# Patient Record
Sex: Female | Born: 1985 | Race: White | Hispanic: No | Smoking: Never smoker
Health system: Southern US, Community
[De-identification: ages and names within clinical notes are randomized; demographics above are authoritative.]

## PROBLEM LIST (undated history)

## (undated) ENCOUNTER — Inpatient Hospital Stay (HOSPITAL_COMMUNITY): Payer: Self-pay

## (undated) DIAGNOSIS — J309 Allergic rhinitis, unspecified: Secondary | ICD-10-CM

## (undated) DIAGNOSIS — G43909 Migraine, unspecified, not intractable, without status migrainosus: Secondary | ICD-10-CM

## (undated) HISTORY — DX: Migraine, unspecified, not intractable, without status migrainosus: G43.909

## (undated) HISTORY — PX: TONSILLECTOMY: SUR1361

## (undated) HISTORY — DX: Allergic rhinitis, unspecified: J30.9

---

## 2006-03-15 ENCOUNTER — Other Ambulatory Visit: Admission: RE | Admit: 2006-03-15 | Discharge: 2006-03-15 | Payer: Self-pay | Admitting: Gynecology

## 2013-11-09 ENCOUNTER — Inpatient Hospital Stay (HOSPITAL_COMMUNITY): Payer: Medicaid Other

## 2013-11-09 ENCOUNTER — Encounter (HOSPITAL_COMMUNITY): Payer: Self-pay

## 2013-11-09 ENCOUNTER — Inpatient Hospital Stay (HOSPITAL_COMMUNITY)
Admission: AD | Admit: 2013-11-09 | Discharge: 2013-11-09 | Disposition: A | Discharge status: Home / Self Care | Payer: Medicaid Other | Source: Ambulatory Visit | Attending: Obstetrics & Gynecology | Admitting: Obstetrics & Gynecology

## 2013-11-09 DIAGNOSIS — M545 Low back pain, unspecified: Secondary | ICD-10-CM | POA: Diagnosis not present

## 2013-11-09 DIAGNOSIS — O209 Hemorrhage in early pregnancy, unspecified: Secondary | ICD-10-CM | POA: Diagnosis not present

## 2013-11-09 DIAGNOSIS — R109 Unspecified abdominal pain: Secondary | ICD-10-CM | POA: Insufficient documentation

## 2013-11-09 LAB — WET PREP, GENITAL
CLUE CELLS WET PREP: NONE SEEN
TRICH WET PREP: NONE SEEN
YEAST WET PREP: NONE SEEN

## 2013-11-09 LAB — CBC
HCT: 37.7 % (ref 36.0–46.0)
HEMOGLOBIN: 13.3 g/dL (ref 12.0–15.0)
MCH: 28.9 pg (ref 26.0–34.0)
MCHC: 35.3 g/dL (ref 30.0–36.0)
MCV: 82 fL (ref 78.0–100.0)
Platelets: 278 10*3/uL (ref 150–400)
RBC: 4.6 MIL/uL (ref 3.87–5.11)
RDW: 12.9 % (ref 11.5–15.5)
WBC: 9.5 10*3/uL (ref 4.0–10.5)

## 2013-11-09 LAB — URINALYSIS, ROUTINE W REFLEX MICROSCOPIC
Bilirubin Urine: NEGATIVE
Glucose, UA: NEGATIVE mg/dL
Ketones, ur: NEGATIVE mg/dL
Nitrite: NEGATIVE
PH: 6 (ref 5.0–8.0)
Protein, ur: NEGATIVE mg/dL
Specific Gravity, Urine: >1.03 — ABNORMAL HIGH (ref 1.005–1.030)
Urobilinogen, UA: 0.2 mg/dL (ref 0.0–1.0)

## 2013-11-09 LAB — HCG, QUANTITATIVE, PREGNANCY: HCG, BETA CHAIN, QUANT, S: 9512 m[IU]/mL — AB (ref <5)

## 2013-11-09 LAB — URINE MICROSCOPIC-ADD ON

## 2013-11-09 LAB — ABO/RH: ABO/RH(D): O POS

## 2013-11-09 NOTE — Discharge Instructions (Signed)
Vaginal Bleeding During Pregnancy, First Trimester °A small amount of bleeding (spotting) from the vagina is relatively common in early pregnancy. It usually stops on its own. Various things may cause bleeding or spotting in early pregnancy. Some bleeding may be related to the pregnancy, and some may not. In most cases, the bleeding is normal and is not a problem. However, bleeding can also be a sign of something serious. Be sure to tell your health care provider about any vaginal bleeding right away. °Some possible causes of vaginal bleeding during the first trimester include: °· Infection or inflammation of the cervix. °· Growths (polyps) on the cervix. °· Miscarriage or threatened miscarriage. °· Pregnancy tissue has developed outside of the uterus and in a fallopian tube (tubal pregnancy). °· Tiny cysts have developed in the uterus instead of pregnancy tissue (molar pregnancy). °HOME CARE INSTRUCTIONS  °Watch your condition for any changes. The following actions may help to lessen any discomfort you are feeling: °· Follow your health care provider's instructions for limiting your activity. If your health care provider orders bed rest, you may need to stay in bed and only get up to use the bathroom. However, your health care provider may allow you to continue light activity. °· If needed, make plans for someone to help with your regular activities and responsibilities while you are on bed rest. °· Keep track of the number of pads you use each day, how often you change pads, and how soaked (saturated) they are. Write this down. °· Do not use tampons. Do not douche. °· Do not have sexual intercourse or orgasms until approved by your health care provider. °· If you pass any tissue from your vagina, save the tissue so you can show it to your health care provider. °· Only take over-the-counter or prescription medicines as directed by your health care provider. °· Do not take aspirin because it can make you  bleed. °· Keep all follow-up appointments as directed by your health care provider. °SEEK MEDICAL CARE IF: °· You have any vaginal bleeding during any part of your pregnancy. °· You have cramps or labor pains. °SEEK IMMEDIATE MEDICAL CARE IF:  °· You have severe cramps in your back or belly (abdomen). °· You have a fever, not controlled by medicine. °· You pass large clots or tissue from your vagina. °· Your bleeding increases. °· You feel lightheaded or weak, or you have fainting episodes. °· You have chills. °· You are leaking fluid or have a gush of fluid from your vagina. °· You pass out while having a bowel movement. °MAKE SURE YOU: °· Understand these instructions. °· Will watch your condition. °· Will get help right away if you are not doing well or get worse. °Document Released: 06/14/2005 Document Revised: 06/25/2013 Document Reviewed: 05/12/2013 °ExitCare® Patient Information ©2014 [employer]. ° °

## 2013-11-09 NOTE — MAU Provider Note (Signed)
History     CSN: [id number]  Arrival date and time: 11/09/13 2148   None     Chief Complaint  Patient presents with  . Abdominal Cramping  . Vaginal Bleeding   HPI  Sydney Crosby is a 28 y.o. G3P1001 at [redacted]w[redacted]d who presents today with bleeding and cramping. She was "shocked" when she had a +HPT as this was not a planned pregnancy. Tonight she started having spotting and 7/10 lower abdominal cramping. She is unsure where she will go for [facility] as her OB from her last pregnancy is no longer practicing.    History reviewed. No pertinent past medical history.  History reviewed. No pertinent past surgical history.  History reviewed. No pertinent family history.  History  Substance Use Topics  . Smoking status: Never Smoker   . Smokeless tobacco: Not on file  . Alcohol Use: No    Allergies: No Known Allergies  Prescriptions prior to admission  Medication Sig Dispense Refill  . acetaminophen (TYLENOL) 325 MG tablet Take 325 mg by mouth every 6 (six) hours as needed for mild pain.      . Prenatal Vit-Fe Fumarate-FA (PRENATAL MULTIVITAMIN) TABS tablet Take 1 tablet by mouth daily at 12 noon.        ROS Physical Exam   Blood pressure 137/80, pulse 98, temperature 98.3 F (36.8 C), temperature source Oral, resp. rate 16, height 5\' 1"  (1.549 m), weight 58.605 kg (129 lb 3.2 oz), last menstrual period 09/26/2013.  Physical Exam  Nursing note and vitals reviewed. Constitutional: She is oriented to person, place, and time. She appears well-developed and well-nourished. No distress.  Cardiovascular: Normal rate.   Respiratory: Effort normal.  GI: Soft. There is no tenderness.  Genitourinary:   External: no lesion Vagina: small amount of white discharge. No blood seen now.  Cervix: pink, smooth, no CMT Uterus: NSSC Adnexa: NT;   Neurological: She is alert and oriented to person, place, and time.  Skin: Skin is warm and dry.  Psychiatric: She has a normal mood and affect.     MAU Course  Procedures  Results for orders placed during the hospital encounter of 11/09/13 (from the past 24 hour(s))  URINALYSIS, ROUTINE W REFLEX MICROSCOPIC     Status: Abnormal   Collection Time    11/09/13 10:05 PM      Result Value Ref Range   Color, Urine YELLOW  YELLOW   APPearance CLEAR  CLEAR   Specific Gravity, Urine >1.030 (*) 1.005 - 1.030   pH 6.0  5.0 - 8.0   Glucose, UA NEGATIVE  NEGATIVE mg/dL   Hgb urine dipstick MODERATE (*) NEGATIVE   Bilirubin Urine NEGATIVE  NEGATIVE   Ketones, ur NEGATIVE  NEGATIVE mg/dL   Protein, ur NEGATIVE  NEGATIVE mg/dL   Urobilinogen, UA 0.2  0.0 - 1.0 mg/dL   Nitrite NEGATIVE  NEGATIVE   Leukocytes, UA TRACE (*) NEGATIVE  URINE MICROSCOPIC-ADD ON     Status: Abnormal   Collection Time    11/09/13 10:05 PM      Result Value Ref Range   Squamous Epithelial / LPF FEW (*) RARE   WBC, UA 0-2  <3 WBC/hpf   RBC / HPF 7-10  <3 RBC/hpf   Bacteria, UA FEW (*) RARE  CBC     Status: None   Collection Time    11/09/13 10:08 PM      Result Value Ref Range   WBC 9.5  4.0 - 10.5 K/uL   RBC  4.60  3.87 - 5.11 MIL/uL   Hemoglobin 13.3  12.0 - 15.0 g/dL   HCT 16.137.7  09.636.0 - 04.546.0 %   MCV 82.0  78.0 - 100.0 fL   MCH 28.9  26.0 - 34.0 pg   MCHC 35.3  30.0 - 36.0 g/dL   RDW 40.912.9  81.111.5 - 91.415.5 %   Platelets 278  150 - 400 K/uL  ABO/RH     Status: None   Collection Time    11/09/13 10:08 PM      Result Value Ref Range   ABO/RH(D) O POS    HCG, QUANTITATIVE, PREGNANCY     Status: Abnormal   Collection Time    11/09/13 10:08 PM      Result Value Ref Range   hCG, Beta Chain, Quant, S 9512 (*) <5 mIU/mL  WET PREP, GENITAL     Status: Abnormal   Collection Time    11/09/13 10:35 PM      Result Value Ref Range   Yeast Wet Prep HPF POC NONE SEEN  NONE SEEN   Trich, Wet Prep NONE SEEN  NONE SEEN   Clue Cells Wet Prep HPF POC NONE SEEN  NONE SEEN   WBC, Wet Prep HPF POC MODERATE (*) NONE SEEN   [place] Ob Comp Less 14 Wks  11/09/2013   CLINICAL  DATA:  28 year old pregnant female with pelvic pain and bleeding. Estimated gestational age of [redacted] weeks 2 days by LMP. Beta HCG of 9,500.  EXAM: TRANSVAGINAL OB ULTRASOUND; OBSTETRIC <14 WK ULTRASOUND  TECHNIQUE: Transvaginal ultrasound was performed for complete evaluation of the gestation as well as the maternal uterus, adnexal regions, and pelvic cul-de-sac.  COMPARISON:  None.  FINDINGS: Intrauterine gestational sac: Visualized/normal in shape.  Yolk sac:  Visualized.  Embryo:  Not visualized  MSD: 8.4  mm   5 w   3  d    US EDC: 07/09/2014  Maternal uterus/adnexae: Small hypoechoic structures adjacent to gestational sac her noted. A small subchorionic hemorrhage is present.  The ovaries bilaterally are unremarkable.  There is no evidence of adnexal mass.  A small amount of free fluid is present.  IMPRESSION: Single intrauterine gestational sac containing a yolk sac. Embryo is not identified at this time. Estimated gestational age by mean sac diameter is 5 weeks 3 days.  Small subchronic hemorrhage.  Small hypoechoic areas along the endometrial canal of uncertain clinical significance.   Electronically Signed   By: Laveda AbbeJeff  Hu M.D.   On: 11/09/2013 23:14   [place] Ob Transvaginal  11/09/2013   CLINICAL DATA:  28 year old pregnant female with pelvic pain and bleeding. Estimated gestational age of [redacted] weeks 2 days by LMP. Beta HCG of 9,500.  EXAM: TRANSVAGINAL OB ULTRASOUND; OBSTETRIC <14 WK ULTRASOUND  TECHNIQUE: Transvaginal ultrasound was performed for complete evaluation of the gestation as well as the maternal uterus, adnexal regions, and pelvic cul-de-sac.  COMPARISON:  None.  FINDINGS: Intrauterine gestational sac: Visualized/normal in shape.  Yolk sac:  Visualized.  Embryo:  Not visualized  MSD: 8.4  mm   5 w   3  d    US EDC: 07/09/2014  Maternal uterus/adnexae: Small hypoechoic structures adjacent to gestational sac her noted. A small subchorionic hemorrhage is present.  The ovaries bilaterally are unremarkable.   There is no evidence of adnexal mass.  A small amount of free fluid is present.  IMPRESSION: Single intrauterine gestational sac containing a yolk sac. Embryo is not identified at this time. Estimated  gestational age by mean sac diameter is 5 weeks 3 days.  Small subchronic hemorrhage.  Small hypoechoic areas along the endometrial canal of uncertain clinical significance.   Electronically Signed   By: Laveda Abbe M.D.   On: 11/09/2013 23:14     Assessment and Plan   1. Vaginal bleeding in pregnant patient at less than [redacted] weeks gestation    Bleeding precautions First trimester danger signs Start [facility] as soon as possible Return to MAU as needed  Follow-up Information   Schedule an appointment as soon as possible for a visit with [facility] [place].   Contact information:   [address] [place] [address] [phone]       Tawnya Crook 11/09/2013, 11:21 PM

## 2013-11-09 NOTE — MAU Note (Signed)
Pt LMP 09/26/2013, +UPT at Dr. Frutoso [place]'s office.  Having cramping, bleeding and lower back pain today. No bleeding at this time.

## 2013-11-10 LAB — GC/CHLAMYDIA PROBE AMP
CT Probe RNA: NEGATIVE
GC Probe RNA: NEGATIVE

## 2013-11-14 ENCOUNTER — Encounter (HOSPITAL_COMMUNITY): Payer: Self-pay | Admitting: *Deleted

## 2013-11-14 ENCOUNTER — Inpatient Hospital Stay (HOSPITAL_COMMUNITY): Payer: Medicaid Other

## 2013-11-14 ENCOUNTER — Inpatient Hospital Stay (HOSPITAL_COMMUNITY)
Admission: AD | Admit: 2013-11-14 | Discharge: 2013-11-14 | Disposition: A | Discharge status: Home / Self Care | Payer: Medicaid Other | Source: Ambulatory Visit | Attending: Obstetrics & Gynecology | Admitting: Obstetrics & Gynecology

## 2013-11-14 DIAGNOSIS — O26859 Spotting complicating pregnancy, unspecified trimester: Secondary | ICD-10-CM

## 2013-11-14 DIAGNOSIS — O209 Hemorrhage in early pregnancy, unspecified: Secondary | ICD-10-CM | POA: Diagnosis not present

## 2013-11-14 DIAGNOSIS — R109 Unspecified abdominal pain: Secondary | ICD-10-CM | POA: Insufficient documentation

## 2013-11-14 DIAGNOSIS — O26899 Other specified pregnancy related conditions, unspecified trimester: Secondary | ICD-10-CM

## 2013-11-14 LAB — CBC WITH DIFFERENTIAL/PLATELET
BASOS ABS: 0 10*3/uL (ref 0.0–0.1)
BASOS PCT: 0 % (ref 0–1)
Eosinophils Absolute: 0.1 10*3/uL (ref 0.0–0.7)
Eosinophils Relative: 1 % (ref 0–5)
HCT: 37.4 % (ref 36.0–46.0)
HEMOGLOBIN: 13 g/dL (ref 12.0–15.0)
Lymphocytes Relative: 23 % (ref 12–46)
Lymphs Abs: 2.3 10*3/uL (ref 0.7–4.0)
MCH: 28.3 pg (ref 26.0–34.0)
MCHC: 34.8 g/dL (ref 30.0–36.0)
MCV: 81.3 fL (ref 78.0–100.0)
Monocytes Absolute: 1.1 10*3/uL — ABNORMAL HIGH (ref 0.1–1.0)
Monocytes Relative: 11 % (ref 3–12)
NEUTROS ABS: 6.8 10*3/uL (ref 1.7–7.7)
Neutrophils Relative %: 66 % (ref 43–77)
Platelets: 296 10*3/uL (ref 150–400)
RBC: 4.6 MIL/uL (ref 3.87–5.11)
RDW: 12.7 % (ref 11.5–15.5)
WBC: 10.3 10*3/uL (ref 4.0–10.5)

## 2013-11-14 LAB — BASIC METABOLIC PANEL
BUN: 5 mg/dL — ABNORMAL LOW (ref 6–23)
CHLORIDE: 100 meq/L (ref 96–112)
CO2: 23 mEq/L (ref 19–32)
Calcium: 9.4 mg/dL (ref 8.4–10.5)
Creatinine, Ser: 0.59 mg/dL (ref 0.50–1.10)
GFR calc non Af Amer: >90 mL/min (ref >90)
Glucose, Bld: 89 mg/dL (ref 70–99)
POTASSIUM: 4 meq/L (ref 3.7–5.3)
Sodium: 138 mEq/L (ref 137–147)

## 2013-11-14 LAB — HCG, QUANTITATIVE, PREGNANCY: HCG, BETA CHAIN, QUANT, S: 18500 m[IU]/mL — AB (ref <5)

## 2013-11-14 MED ORDER — ONDANSETRON HCL 4 MG/2ML IJ SOLN
4.0000 mg | Freq: Once | INTRAMUSCULAR | Status: AC
  Administered 2013-11-14: 4 mg via INTRAVENOUS
  Filled 2013-11-14: qty 2

## 2013-11-14 MED ORDER — IBUPROFEN 600 MG PO TABS
600.0000 mg | ORAL_TABLET | Freq: Once | ORAL | Status: AC
  Administered 2013-11-14: 600 mg via ORAL
  Filled 2013-11-14: qty 1

## 2013-11-14 MED ORDER — LACTATED RINGERS IV SOLN
INTRAVENOUS | Status: DC
  Administered 2013-11-14: 17:00:00 via INTRAVENOUS

## 2013-11-14 MED ORDER — HYDROMORPHONE HCL PF 1 MG/ML IJ SOLN
1.0000 mg | Freq: Once | INTRAMUSCULAR | Status: AC
  Administered 2013-11-14: 1 mg via INTRAVENOUS
  Filled 2013-11-14: qty 1

## 2013-11-14 NOTE — Discharge Instructions (Signed)
Your ultrasound today shows that you are [redacted] weeks pregnant and there is a heart beat. You continue to have some bleeding around the pregnancy. This will hopefully resolve by [redacted] weeks gestation. If you have heavy bleeding, severe pain or other problems, return here.

## 2013-11-14 NOTE — MAU Note (Signed)
Patient states she has been having abdominal pain and had heavy bright red bleeding today. Feels nauseated, no vomiting.

## 2013-11-14 NOTE — MAU Provider Note (Signed)
CSN: [id number]     Arrival date & time 11/14/13  1602 History   None    Chief Complaint  Patient presents with  . Abdominal Pain  . Vaginal Bleeding     (Consider location/radiation/quality/duration/timing/severity/associated sxs/prior Treatment) Patient is a 28 y.o. female presenting with abdominal pain and vaginal bleeding.  Abdominal Pain The primary symptoms of the illness include abdominal pain and vaginal bleeding. The current episode started 3 to 5 hours ago. The onset of the illness was sudden.  The patient states that she believes she is currently pregnant. Additional symptoms associated with the illness include back pain.  Vaginal Bleeding Associated symptoms include abdominal pain.   Sydney Crosby is a 28 y.o. G2P1001 @ [redacted]w[redacted]d gestation who presents to the MAU with abdominal cramping. She has had some mild cramping and bleeding off and on but the severe pain started about 3 hours prior to arrival to MAU.  Past Medical History  Diagnosis Date  . Medical history non-contributory    Past Surgical History  Procedure Laterality Date  . No past surgeries     No family history on file. History  Substance Use Topics  . Smoking status: Never Smoker   . Smokeless tobacco: Not on file  . Alcohol Use: No   OB History   Grav Para Term Preterm Abortions TAB SAB Ect Mult Living   2 1 1       1      Review of Systems  Gastrointestinal: Positive for abdominal pain.  Genitourinary: Positive for vaginal bleeding.  Musculoskeletal: Positive for back pain.      Allergies  Review of patient's allergies indicates no known allergies.  Home Medications  No current outpatient prescriptions on file. BP 138/90  Pulse 83  Temp(Src) 99 F (37.2 C) (Oral)  Resp 16  Ht 5\' 1"  (1.549 m)  Wt 130 lb 12.8 oz (59.33 kg)  BMI 24.73 kg/m2  SpO2 100%  LMP 09/26/2013 Physical Exam  Nursing note and vitals reviewed. Constitutional: She is oriented to person, place, and time. She appears  well-developed and well-nourished.  HENT:  Head: Atraumatic.  Eyes: EOM are normal.  Neck: Neck supple.  Cardiovascular: Normal rate.   Pulmonary/Chest: Effort normal.  Abdominal: Soft. There is tenderness in the right lower quadrant and left lower quadrant. There is no rebound, no guarding and no CVA tenderness.  Genitourinary:  External genitalia without lesions. Cervix fingertip, small blood vaginal vault. Uterus slightly enlarged.   Musculoskeletal: Normal range of motion.  Neurological: She is alert and oriented to person, place, and time. No cranial nerve deficit.  Skin: Skin is warm and dry.  Psychiatric: She has a normal mood and affect. Her behavior is normal.    Results for orders placed during the hospital encounter of 11/14/13 (from the past 24 hour(s))  CBC WITH DIFFERENTIAL     Status: Abnormal   Collection Time    11/14/13  4:53 PM      Result Value Ref Range   WBC 10.3  4.0 - 10.5 K/uL   RBC 4.60  3.87 - 5.11 MIL/uL   Hemoglobin 13.0  12.0 - 15.0 g/dL   HCT 82.9  56.2 - 13.0 %   MCV 81.3  78.0 - 100.0 fL   MCH 28.3  26.0 - 34.0 pg   MCHC 34.8  30.0 - 36.0 g/dL   RDW 86.5  78.4 - 69.6 %   Platelets 296  150 - 400 K/uL   Neutrophils Relative % 66  43 - 77 %   Neutro Abs 6.8  1.7 - 7.7 K/uL   Lymphocytes Relative 23  12 - 46 %   Lymphs Abs 2.3  0.7 - 4.0 K/uL   Monocytes Relative 11  3 - 12 %   Monocytes Absolute 1.1 (*) 0.1 - 1.0 K/uL   Eosinophils Relative 1  0 - 5 %   Eosinophils Absolute 0.1  0.0 - 0.7 K/uL   Basophils Relative 0  0 - 1 %   Basophils Absolute 0.0  0.0 - 0.1 K/uL  BASIC METABOLIC PANEL     Status: Abnormal   Collection Time    11/14/13  4:53 PM      Result Value Ref Range   Sodium 138  137 - 147 mEq/L   Potassium 4.0  3.7 - 5.3 mEq/L   Chloride 100  96 - 112 mEq/L   CO2 23  19 - 32 mEq/L   Glucose, Bld 89  70 - 99 mg/dL   BUN 5 (*) 6 - 23 mg/dL   Creatinine, Ser 7.820.59  0.50 - 1.10 mg/dL   Calcium 9.4  8.4 - 95.610.5 mg/dL   GFR calc non  Af Amer >90  >90 mL/min   GFR calc Af Amer >90  >90 mL/min  HCG, QUANTITATIVE, PREGNANCY     Status: Abnormal   Collection Time    11/14/13  4:53 PM      Result Value Ref Range   hCG, Beta Chain, Quant, S 18500 (*) <5 mIU/mL    ED Course  Procedures [place] Ob Transvaginal  11/14/2013   CLINICAL DATA:  Bleeding, pregnant  EXAM: TRANSVAGINAL OB ULTRASOUND  TECHNIQUE: Transvaginal ultrasound was performed for complete evaluation of the gestation as well as the maternal uterus, adnexal regions, and pelvic cul-de-sac.  COMPARISON:  11/09/2013  FINDINGS: Intrauterine gestational sac: Present, single  Yolk sac:  Present  Embryo:  Present  Cardiac Activity: Present  Heart Rate: 114 bpm  MSD:   mm    w     d  CRL:   2.9  mm   6 w 0d                  US EDC: 07/09/2014  Maternal uterus/adnexae: There is a large subchorionic hemorrhage.  Right ovary 41 x 22 x 32 mm  Left ovary 29 x 18 x 14 mm  Small amount of left pelvic free fluid  IMPRESSION: 1. Single live 6 week 0 day intrauterine gestation. 2. Subchorionic hemorrhage. 3. Small amount of nonspecific pelvic fluid.   Electronically Signed   By: Oley Balmaniel  Hassell M.D.   On: 11/14/2013 18:45     MDM  28 y.o. G2P1001 @ 3622w0d gestation by LMP with bleeding and cramping. Stable for discharge with ultrasound showing a 6 week viable IUP. I have reviewed this patient's vital signs, nurses notes, appropriate labs and imaging.  I have discussed findings in detail with the patient and possibility of increased bleeding and possibility of miscarriage. Instructions given on threatened AB. She will start her prenatal care. She will return here for problems.

## 2013-11-18 NOTE — MAU Provider Note (Signed)
Attestation of Attending Supervision of Advanced Practitioner (CNM/NP): Evaluation and management procedures were performed by the Advanced Practitioner under my supervision and collaboration. I have reviewed the Advanced Practitioner's note and chart, and I agree with the management and plan.  Alajia Schmelzer H. 10:46 PM

## 2013-12-27 ENCOUNTER — Inpatient Hospital Stay (HOSPITAL_COMMUNITY)
Admission: AD | Admit: 2013-12-27 | Discharge: 2013-12-27 | Disposition: A | Discharge status: Home / Self Care | Payer: Medicaid Other | Source: Ambulatory Visit | Attending: Obstetrics & Gynecology | Admitting: Obstetrics & Gynecology

## 2013-12-27 ENCOUNTER — Encounter (HOSPITAL_COMMUNITY): Payer: Self-pay | Admitting: *Deleted

## 2013-12-27 ENCOUNTER — Inpatient Hospital Stay (HOSPITAL_COMMUNITY): Payer: Medicaid Other

## 2013-12-27 DIAGNOSIS — O209 Hemorrhage in early pregnancy, unspecified: Secondary | ICD-10-CM | POA: Insufficient documentation

## 2013-12-27 DIAGNOSIS — R109 Unspecified abdominal pain: Secondary | ICD-10-CM | POA: Insufficient documentation

## 2013-12-27 DIAGNOSIS — M545 Low back pain, unspecified: Secondary | ICD-10-CM | POA: Insufficient documentation

## 2013-12-27 DIAGNOSIS — O208 Other hemorrhage in early pregnancy: Secondary | ICD-10-CM | POA: Insufficient documentation

## 2013-12-27 LAB — URINALYSIS, ROUTINE W REFLEX MICROSCOPIC
Bilirubin Urine: NEGATIVE
Glucose, UA: NEGATIVE mg/dL
Ketones, ur: NEGATIVE mg/dL
Nitrite: NEGATIVE
Protein, ur: NEGATIVE mg/dL
SPECIFIC GRAVITY, URINE: 1.01 (ref 1.005–1.030)
UROBILINOGEN UA: 0.2 mg/dL (ref 0.0–1.0)
pH: 7 (ref 5.0–8.0)

## 2013-12-27 LAB — URINE MICROSCOPIC-ADD ON

## 2013-12-27 MED ORDER — ACETAMINOPHEN 325 MG PO TABS
650.0000 mg | ORAL_TABLET | Freq: Once | ORAL | Status: AC
  Administered 2013-12-27: 650 mg via ORAL
  Filled 2013-12-27: qty 2

## 2013-12-27 NOTE — MAU Provider Note (Signed)
History     CSN: [id number]  Arrival date and time: 12/27/13 1013   First Provider Initiated Contact with Patient 12/27/13 1135      Chief Complaint  Patient presents with  . Vaginal Bleeding   HPI Sydney Crosby 28 y.o. [redacted]w[redacted]d   Awakened today with vaginal bleeding at 2 am.  Has had periodic cramping as well.  Passed one lime sized clot and had pain when it passed.  Has an appointment to start prenatal care in [place] on 01-07-14.  Was seen here previously in MAU and had a large subchorionic hemorrhage earlier in the pregnancy.  Has had periodic spotting and small amounts of blood.  Today the bleeding was heavier and client is very worried.  Asking for pain medication to help with the cramping.  OB History   Grav Para Term Preterm Abortions TAB SAB Ect Mult Living   2 1 1       1       Past Medical History  Diagnosis Date  . Medical history non-contributory     Past Surgical History  Procedure Laterality Date  . No past surgeries      History reviewed. No pertinent family history.  History  Substance Use Topics  . Smoking status: Never Smoker   . Smokeless tobacco: Never Used  . Alcohol Use: No    Allergies: No Known Allergies  Prescriptions prior to admission  Medication Sig Dispense Refill  . acetaminophen (TYLENOL) 325 MG tablet Take 325 mg by mouth every 6 (six) hours as needed for mild pain.      . diphenhydrAMINE (BENADRYL) 25 mg capsule Take 25 mg by mouth every 6 (six) hours as needed for allergies.      . Prenatal Vit-Fe Fumarate-FA (PRENATAL MULTIVITAMIN) TABS tablet Take 1 tablet by mouth daily at 12 noon.        Review of Systems  Constitutional: Negative for fever.  Gastrointestinal: Positive for abdominal pain. Negative for nausea and vomiting.  Genitourinary:       No vaginal discharge. Vaginal bleeding. No dysuria.   Physical Exam   Blood pressure 124/73, pulse 94, temperature 98.2 F (36.8 C), temperature source Oral, resp. rate 18, height  5' (1.524 m), weight 129 lb (58.514 kg), last menstrual period 09/26/2013.  Physical Exam  Nursing note and vitals reviewed. Constitutional: She is oriented to person, place, and time. She appears well-developed and well-nourished.  HENT:  Head: Normocephalic.  Eyes: EOM are normal.  Neck: Neck supple.  GI: Soft. There is tenderness. There is no rebound and no guarding.  Mild diffuse tenderness  Genitourinary:  Speculum exam: Vagina - Traces of dark blood seen.  No active bleeding. Cervix - Small clot see in cervical os Bimanual exam: Cervix multiparous, closed, not palpated as long, question funneling Uterus non tender Adnexa non tender, no masses bilaterally Chaperone present for exam.  Musculoskeletal: Normal range of motion.  Neurological: She is alert and oriented to person, place, and time.  Skin: Skin is warm and dry.  Psychiatric: She has a normal mood and affect.    MAU Course  Procedures Results for orders placed during the hospital encounter of 12/27/13 (from the past 24 hour(s))  URINALYSIS, ROUTINE W REFLEX MICROSCOPIC     Status: Abnormal   Collection Time    12/27/13 10:32 AM      Result Value Ref Range   Color, Urine AMBER (*) YELLOW   APPearance CLEAR  CLEAR   Specific Gravity, Urine 1.010  1.005 - 1.030   pH 7.0  5.0 - 8.0   Glucose, UA NEGATIVE  NEGATIVE mg/dL   Hgb urine dipstick LARGE (*) NEGATIVE   Bilirubin Urine NEGATIVE  NEGATIVE   Ketones, ur NEGATIVE  NEGATIVE mg/dL   Protein, ur NEGATIVE  NEGATIVE mg/dL   Urobilinogen, UA 0.2  0.0 - 1.0 mg/dL   Nitrite NEGATIVE  NEGATIVE   Leukocytes, UA SMALL (*) NEGATIVE  URINE MICROSCOPIC-ADD ON     Status: Abnormal   Collection Time    12/27/13 10:32 AM      Result Value Ref Range   Squamous Epithelial / LPF MANY (*) RARE   WBC, UA 3-6  <3 WBC/hpf   RBC / HPF 7-10  <3 RBC/hpf   Bacteria, UA RARE  RARE   Urine-Other MUCOUS PRESENT     MDM CLINICAL DATA: Vaginal bleeding. Assess cervical length.  Estimated  gestational age is 13 weeks 1 day. Subchorionic hemorrhage was  identified on ultrasound dated 11/14/2013.  EXAM:  OBSTETRIC <14 WK ULTRASOUND  TECHNIQUE:  Transabdominal ultrasound was performed for evaluation of the  gestation as well as the maternal uterus and adnexal regions.  COMPARISON: Obstetric ultrasound 11/14/2013  FINDINGS:  Intrauterine gestational sac: Visualized/normal in shape.  Yolk sac: Visualized  Embryo: Visualized  Cardiac Activity: Visualized  Heart Rate: 169 bpm  CRL: 59.4 mm 12 w 4 d US EDC: 07/07/2014  Maternal uterus/adnexae: Small subchorionic hemorrhage is  visualized, measuring 2.9 x 0.9 x 0.8 cm. This collection appears  slightly smaller than it did on 11/14/2013.  IMPRESSION:  1. Single living intrauterine gestation with a sign gestational age  of [redacted] weeks 1 day. Estimated gestational age by today's ultrasound  is concordant.  2. Small subchorionic hemorrhage. The subchorionic collection  appears smaller on today's ultrasound than on 11/14/2013.  Ultrasound for fetal anatomic evaluation at 18-[redacted] weeks gestational  age is recommended.    Assessment and Plan  Vaginal bleeding in pregnancy - lessening subchorionic hemorrhage  Plan Continue with Prenatal care as planned Pelvic rest until you see your doctor Drink at least 8 8-oz glasses of water every day. Take Tylenol 325 mg 2 tablets by mouth every 4 hours if needed for pain. Seek additional care if your bleeding or your pain worsens.  Abigal Choung L Alysse Rathe 12/27/2013, 11:45 AM

## 2013-12-27 NOTE — MAU Provider Note (Signed)
Attestation of Attending Supervision of Advanced Practitioner (PA/CNM/NP): Evaluation and management procedures were performed by the Advanced Practitioner under my supervision and collaboration.  I have reviewed the Advanced Practitioner's note and chart, and I agree with the management and plan.  Sahej Schrieber, MD, FACOG Attending Obstetrician & Gynecologist Faculty Practice, [facility]  

## 2013-12-27 NOTE — Discharge Instructions (Signed)
Continue with Prenatal care as planned Pelvic rest until you see your doctor Drink at least 8 8-oz glasses of water every day. Take Tylenol 325 mg 2 tablets by mouth every 4 hours if needed for pain. Seek additional care if your bleeding or your pain worsens.

## 2013-12-27 NOTE — MAU Note (Signed)
Pt presents with complaints of bright red vaginal bleeding since early this morning and passed a large colt. She has been having some lower back pain today also.

## 2014-07-20 ENCOUNTER — Encounter (HOSPITAL_COMMUNITY): Payer: Self-pay | Admitting: *Deleted

## 2014-09-14 ENCOUNTER — Encounter (HOSPITAL_COMMUNITY): Payer: Self-pay | Admitting: *Deleted

## 2015-08-08 IMAGING — US US OB COMP LESS 14 WK
1 series · 14 of 28 positions shown · non-contrast
Comparison: None.

CLINICAL DATA: 28-year-old pregnant female with pelvic pain and
bleeding. Estimated gestational age of 5 weeks 2 days by LMP. Beta
HCG of [DATE].

EXAM:
TRANSVAGINAL OB ULTRASOUND; OBSTETRIC <14 WK ULTRASOUND
TECHNIQUE: Transvaginal ultrasound was performed for complete evaluation of the
gestation as well as the maternal uterus, adnexal regions, and
pelvic cul-de-sac.

[Series 1: us ob comp less 14 wks · 14 of 56 slices shown]
[im 3/56]
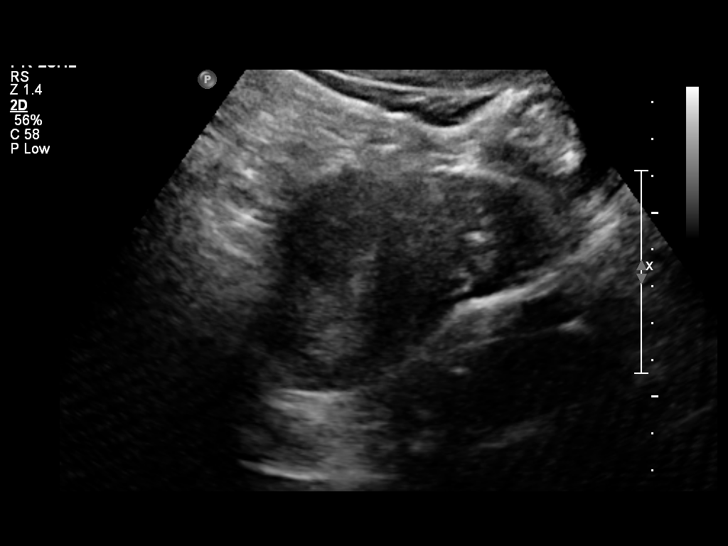
[im 7/56]
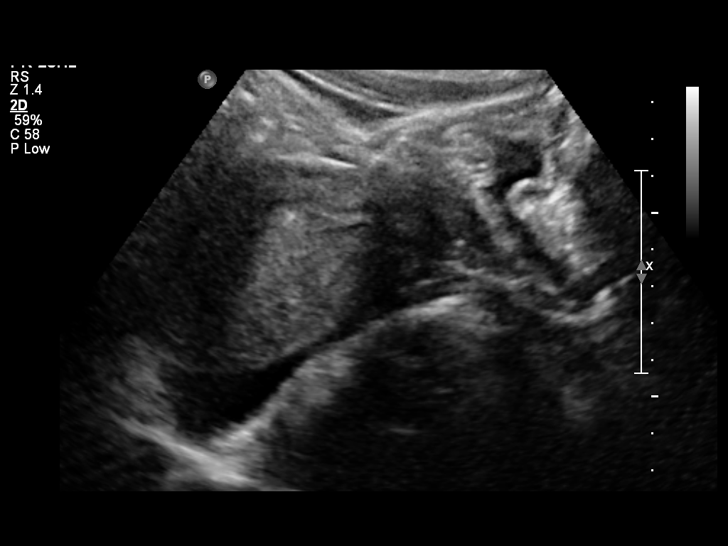
[im 11/56]
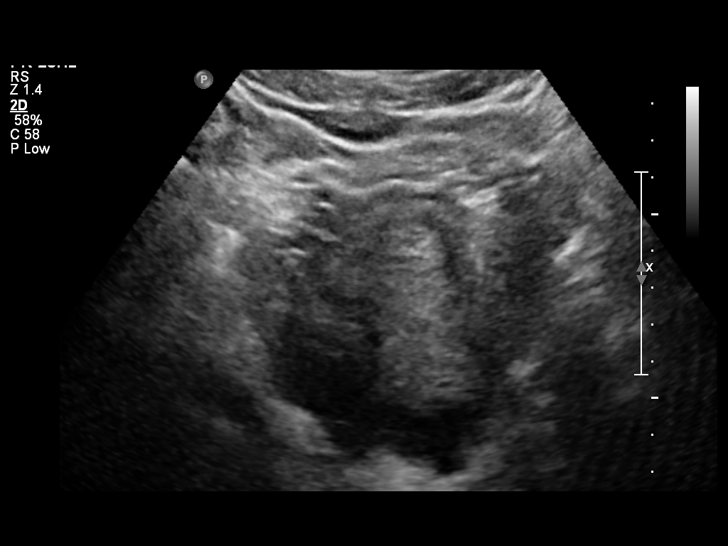
[im 15/56]
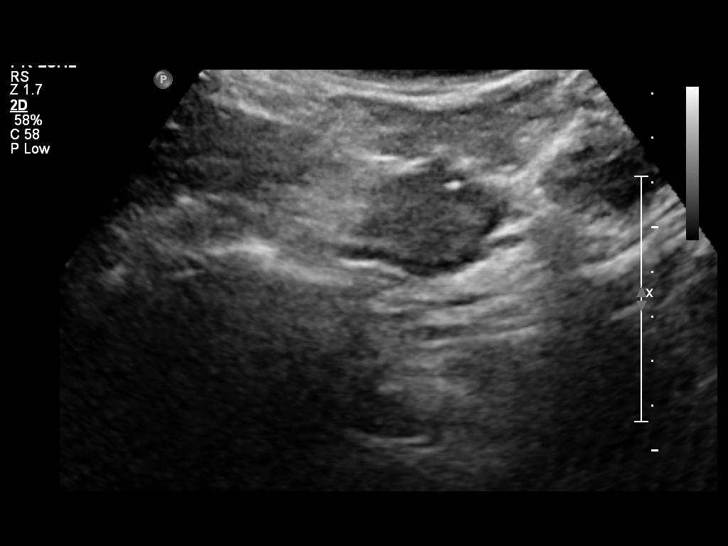
[im 19/56]
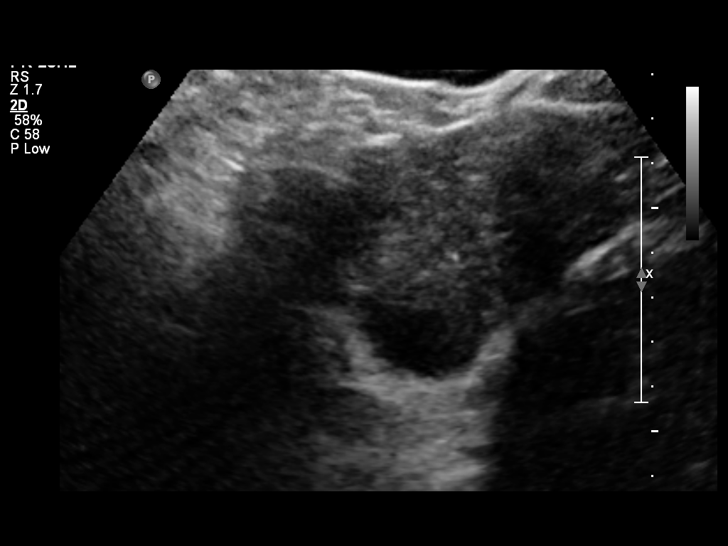
[im 23/56]
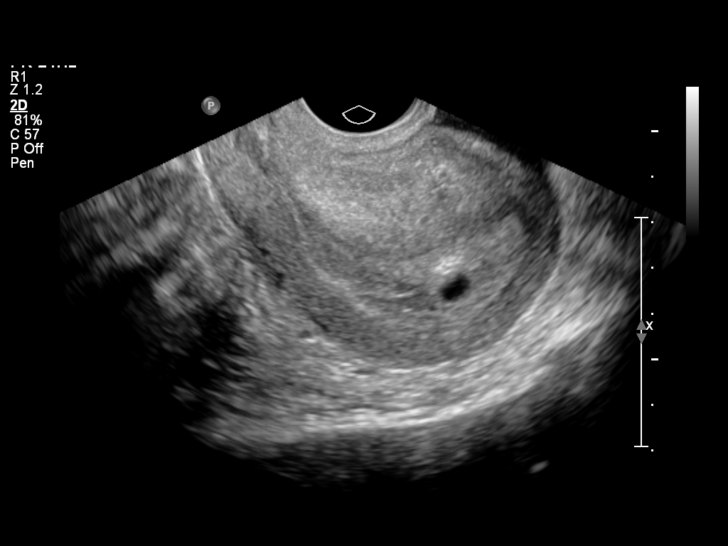
[im 27/56]
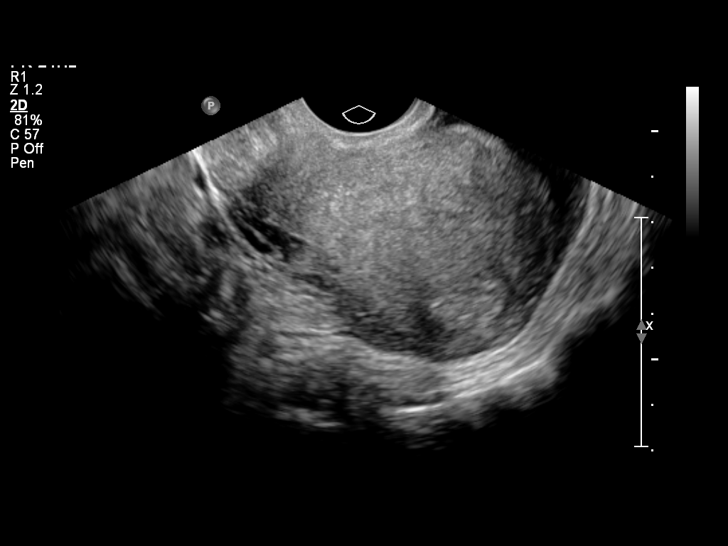
[im 31/56]
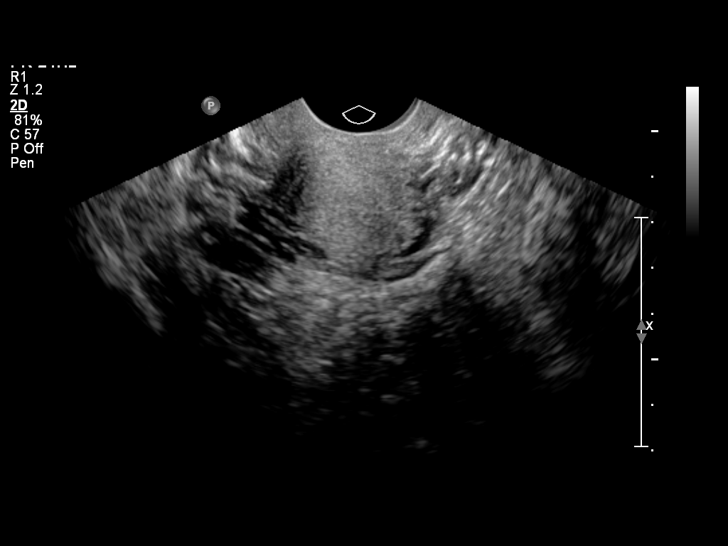
[im 35/56]
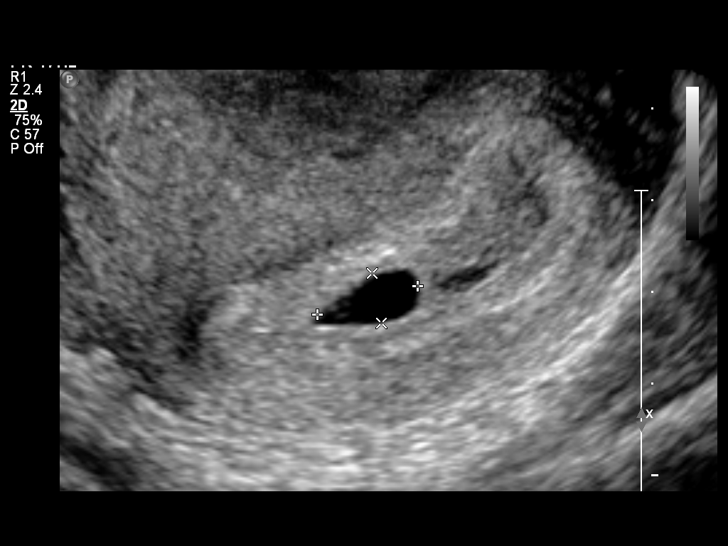
[im 39/56]
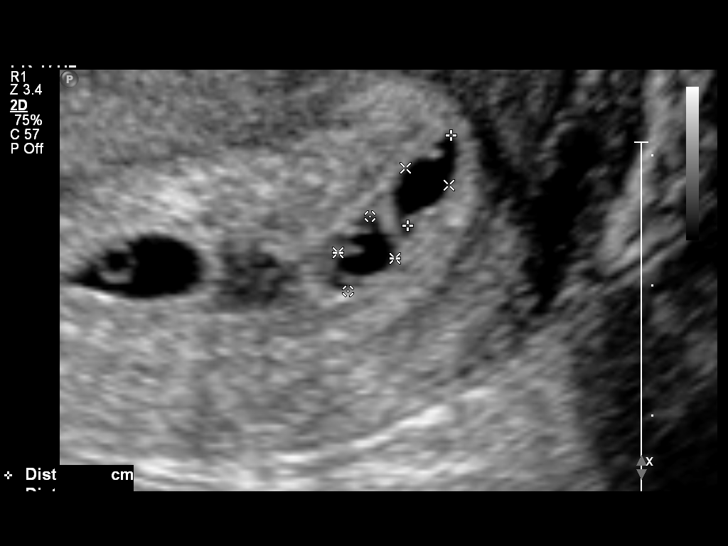
[im 43/56]
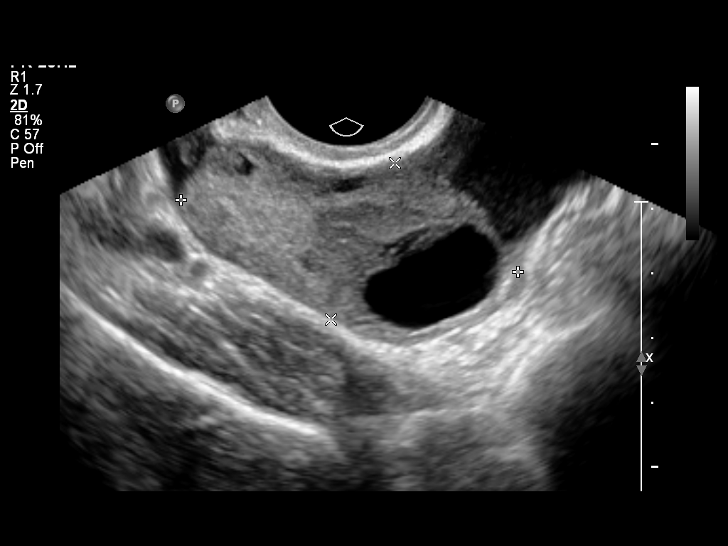
[im 47/56]
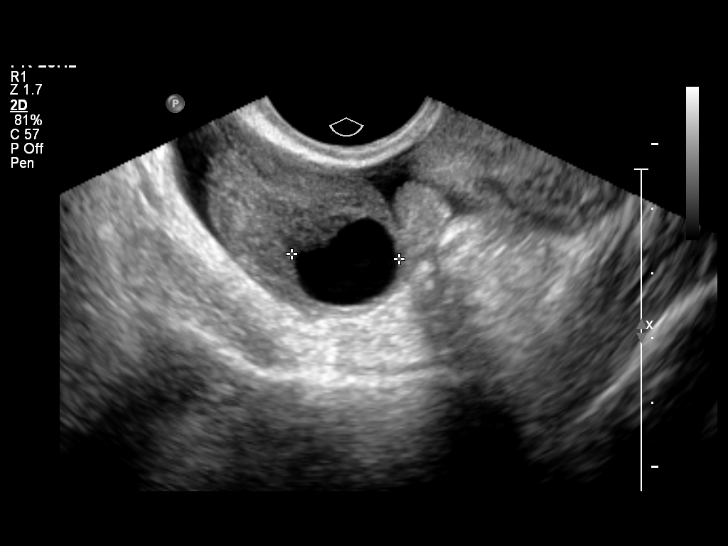
[im 51/56]
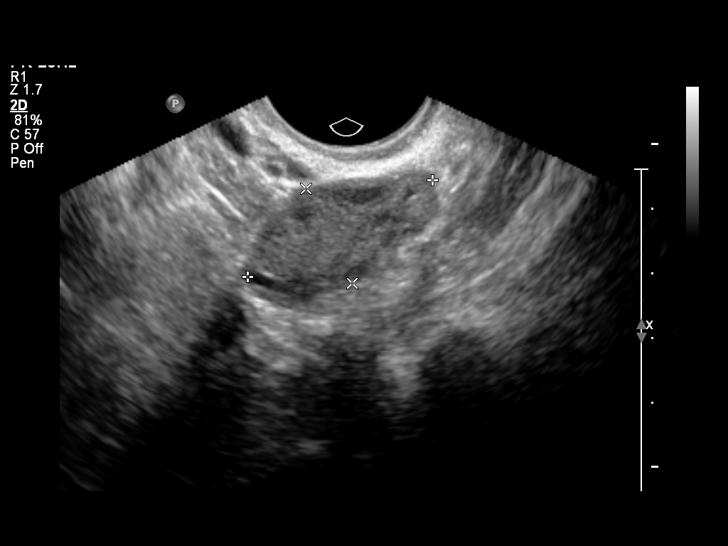
[im 56/56]
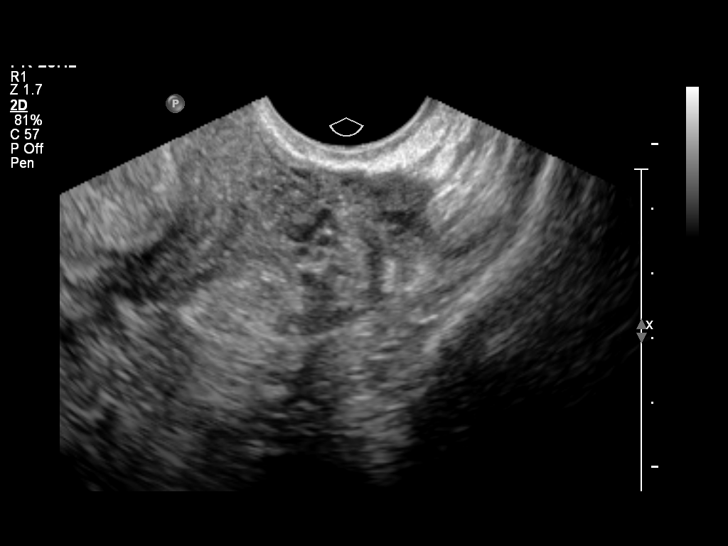

[14 of 28 positions shown; findings below may reference images not displayed]

FINDINGS: Intrauterine gestational sac: Visualized/normal in shape.

Yolk sac:  Visualized.

Embryo:  Not visualized

MSD: 8.4  mm   5 w   3  d    US EDC: 07/09/2014

Maternal uterus/adnexae: Small hypoechoic structures adjacent to
gestational sac her noted. A small subchorionic hemorrhage is
present.

The ovaries bilaterally are unremarkable.

There is no evidence of adnexal mass.

A small amount of free fluid is present.
IMPRESSION: Single intrauterine gestational sac containing a yolk sac. Embryo is
not identified at this time. Estimated gestational age by mean sac
diameter is 5 weeks 3 days.

Small subchronic hemorrhage.

Small hypoechoic areas along the endometrial canal of uncertain
clinical significance.

## 2015-08-13 IMAGING — US US OB TRANSVAGINAL
1 series · 14 of 28 positions shown · non-contrast
Comparison: 11/09/2013

CLINICAL DATA: Bleeding, pregnant

EXAM:
TRANSVAGINAL OB ULTRASOUND
TECHNIQUE: Transvaginal ultrasound was performed for complete evaluation of the
gestation as well as the maternal uterus, adnexal regions, and
pelvic cul-de-sac.

[Series 1: us ob comp less 14 wks · 14 of 28 slices shown]
[im 2/28]
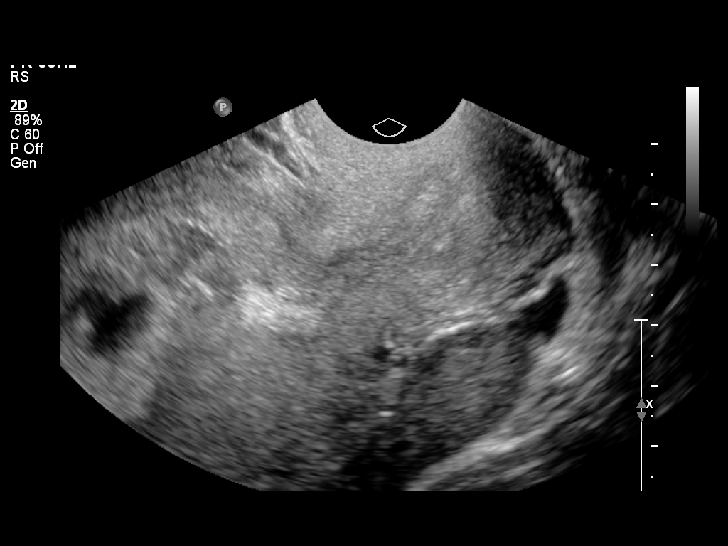
[im 4/28]
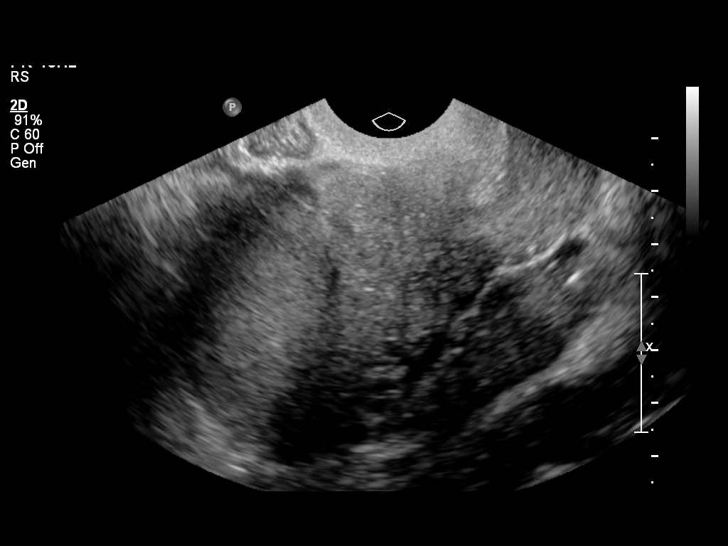
[im 6/28]
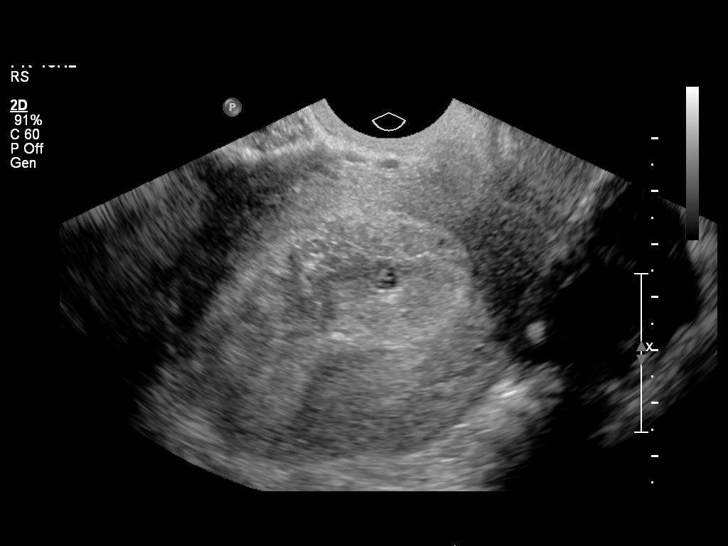
[im 8/28]
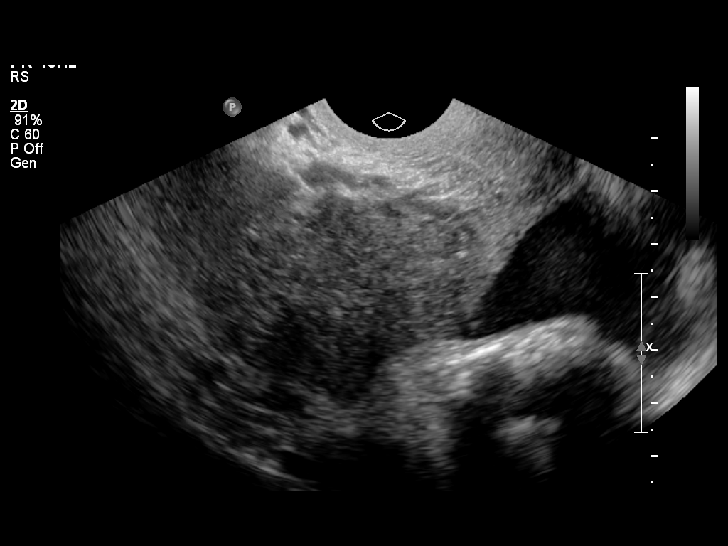
[im 10/28]
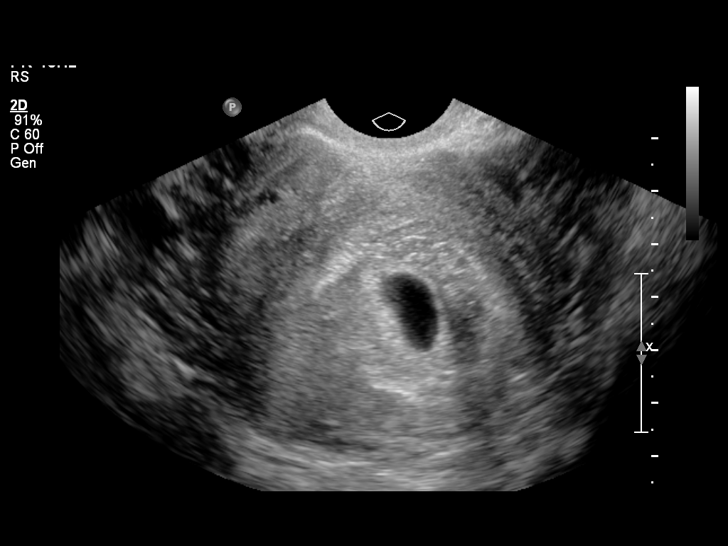
[im 12/28]
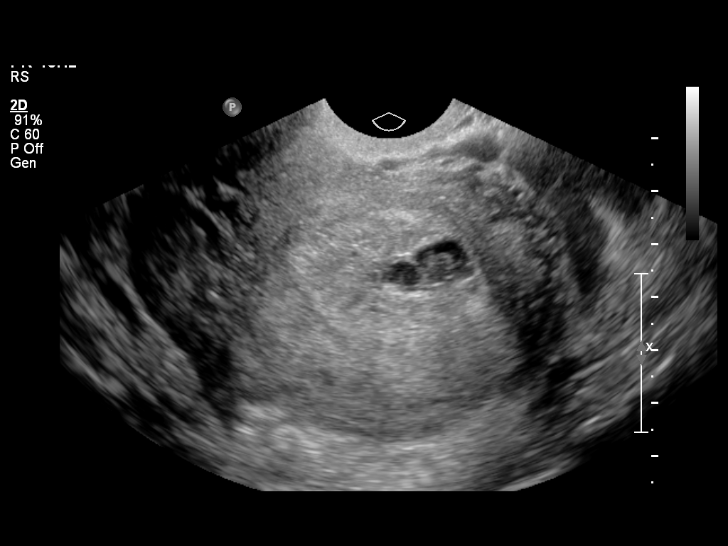
[im 14/28]
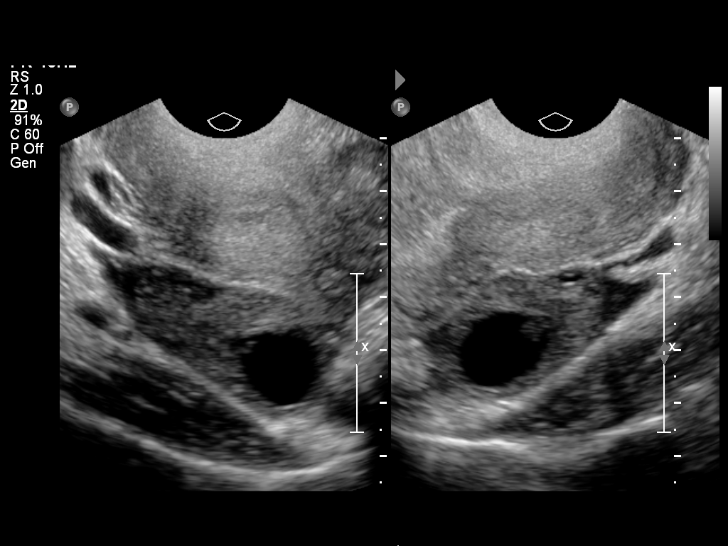
[im 16/28]
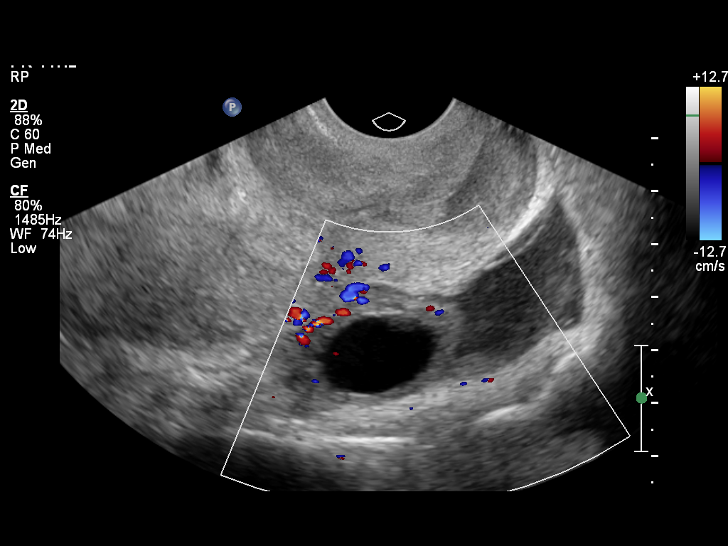
[im 18/28]
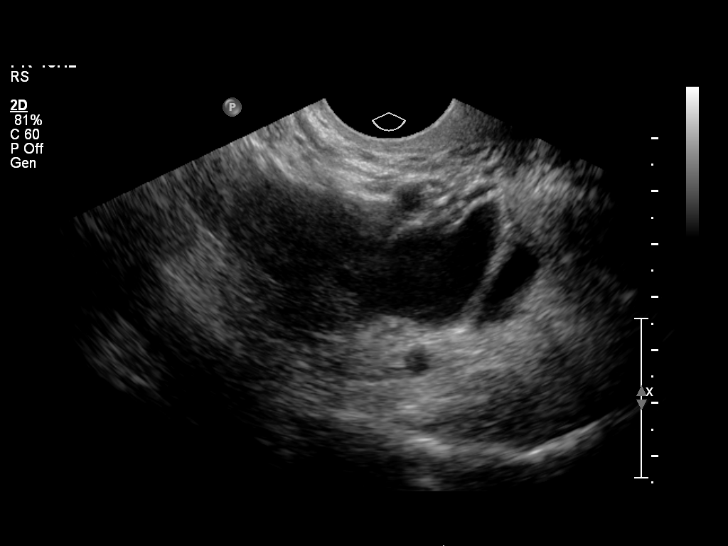
[im 20/28]
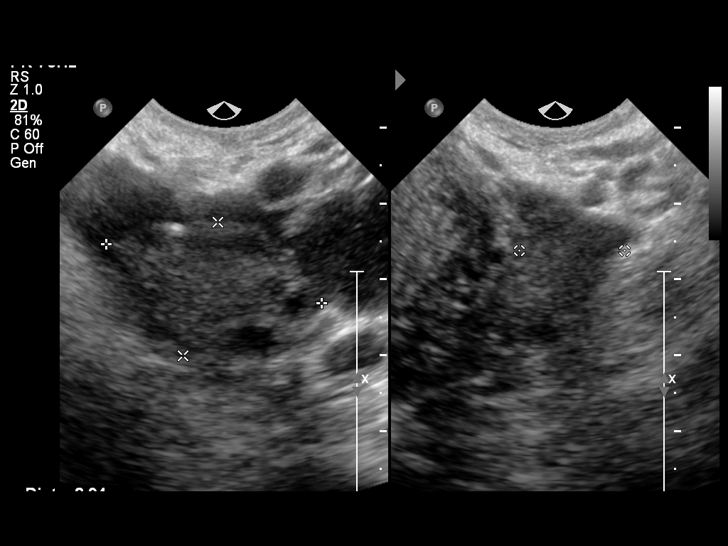
[im 22/28]
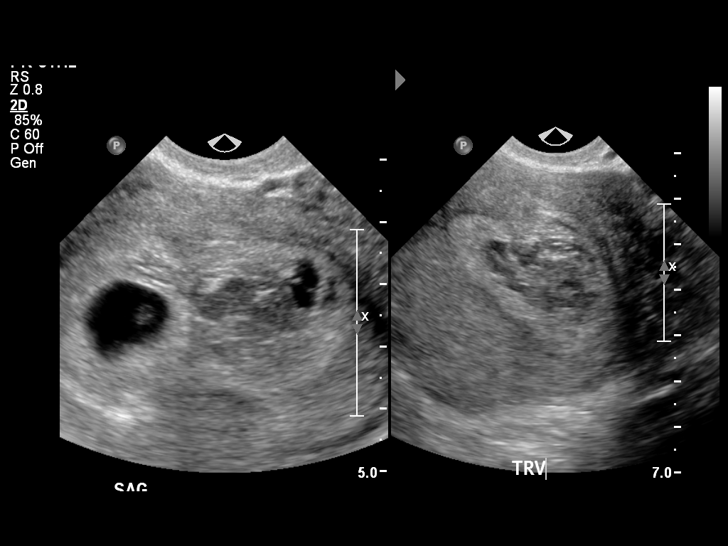
[im 24/28]
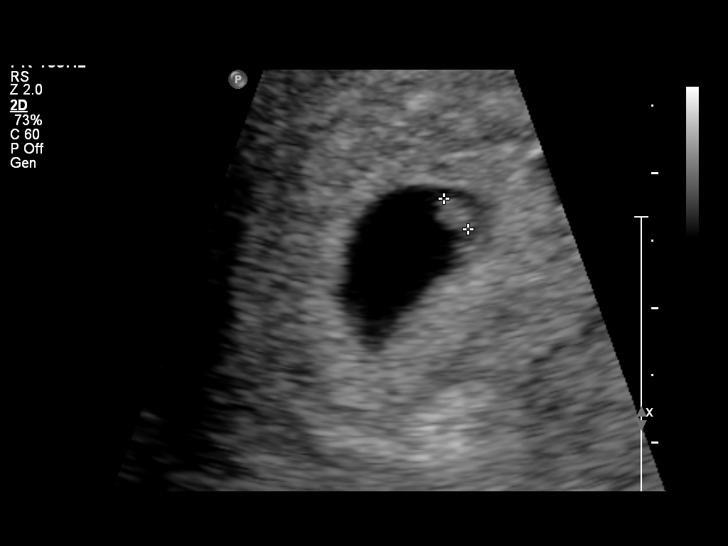
[im 26/28]
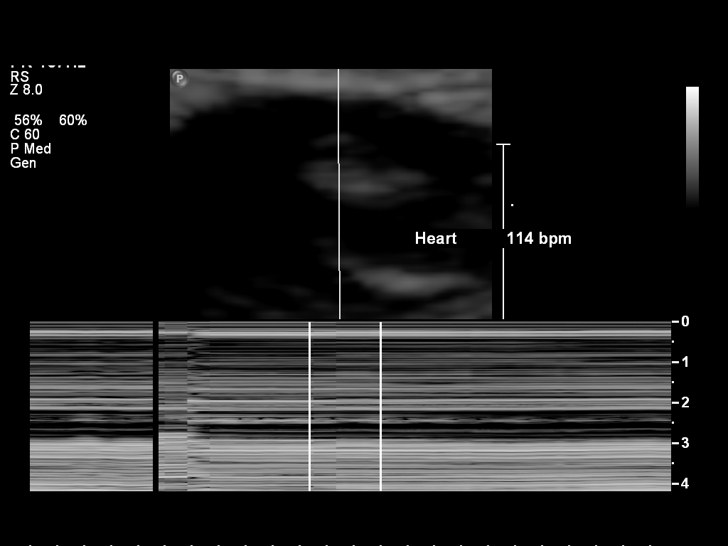
[im 28/28]
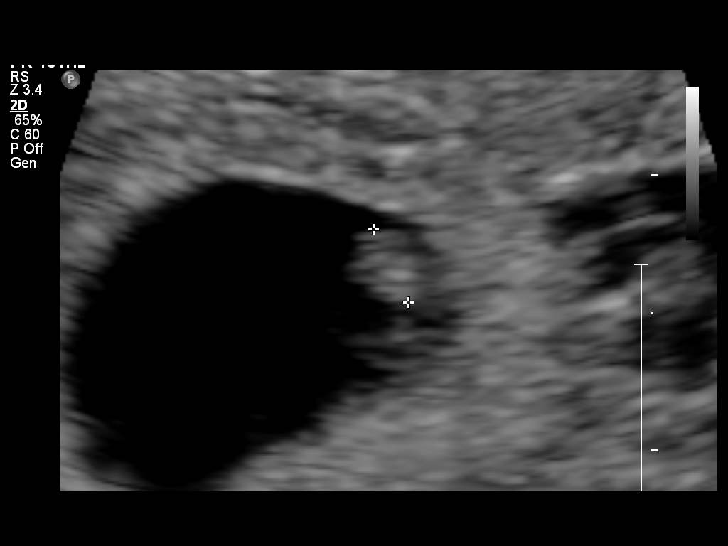

[14 of 28 positions shown; findings below may reference images not displayed]

FINDINGS: Intrauterine gestational sac: Present, single

Yolk sac:  Present

Embryo:  Present

Cardiac Activity: Present

Heart Rate: 114 bpm

MSD:   mm    w     d

CRL:   2.9  mm   6 w 0d                  US EDC: 07/09/2014

Maternal uterus/adnexae: There is a large subchorionic hemorrhage.

Right ovary 41 x 22 x 32 mm

Left ovary 29 x 18 x 14 mm

Small amount of left pelvic free fluid
IMPRESSION: 1. Single live 6 week 0 day intrauterine gestation.
2. Subchorionic hemorrhage.
3. Small amount of nonspecific pelvic fluid.

## 2015-09-25 IMAGING — US US OB COMP LESS 14 WK
1 series · 14 of 27 positions shown · non-contrast
Comparison: Obstetric ultrasound 11/14/2013

CLINICAL DATA: Vaginal bleeding. Assess cervical length. Estimated
gestational age is 13 weeks 1 day. Subchorionic hemorrhage was
identified on ultrasound dated 11/14/2013.

EXAM:
OBSTETRIC <14 WK ULTRASOUND
TECHNIQUE: Transabdominal ultrasound was performed for evaluation of the
gestation as well as the maternal uterus and adnexal regions.

[Series 1: us ob comp less 14 wks · 14 of 27 slices shown]
[im 1/27]
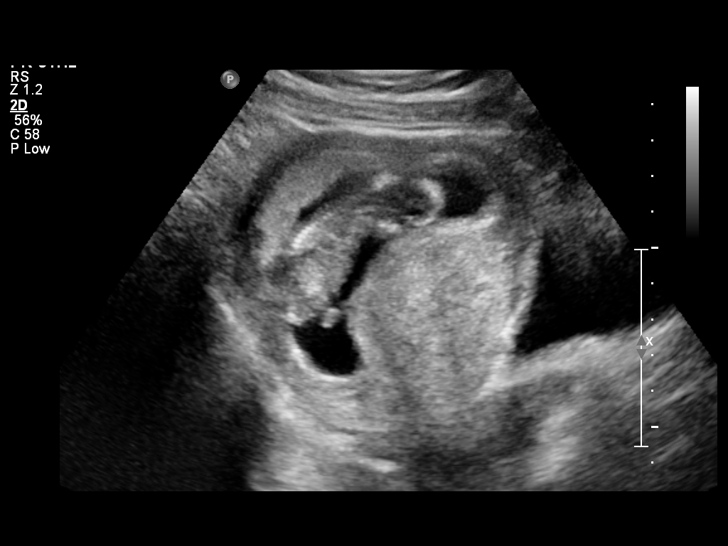
[im 3/27]
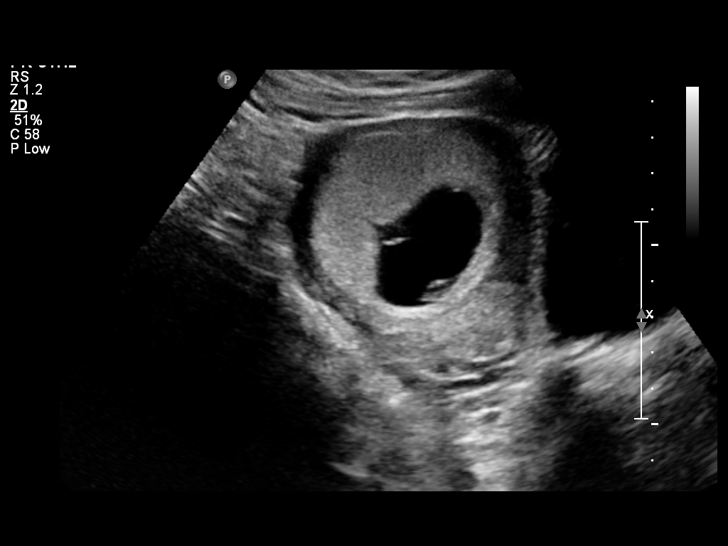
[im 5/27]
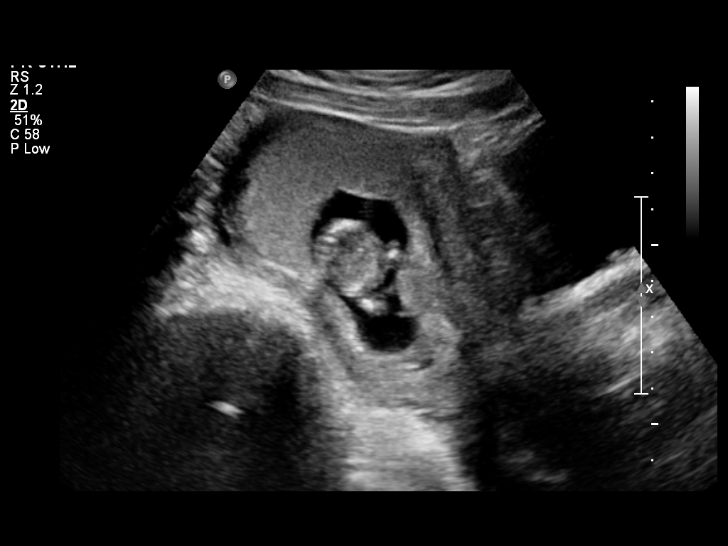
[im 7/27]
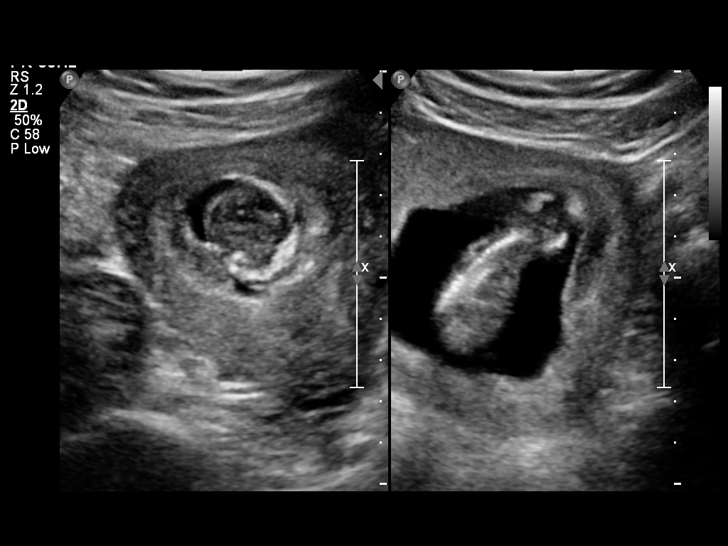
[im 9/27]
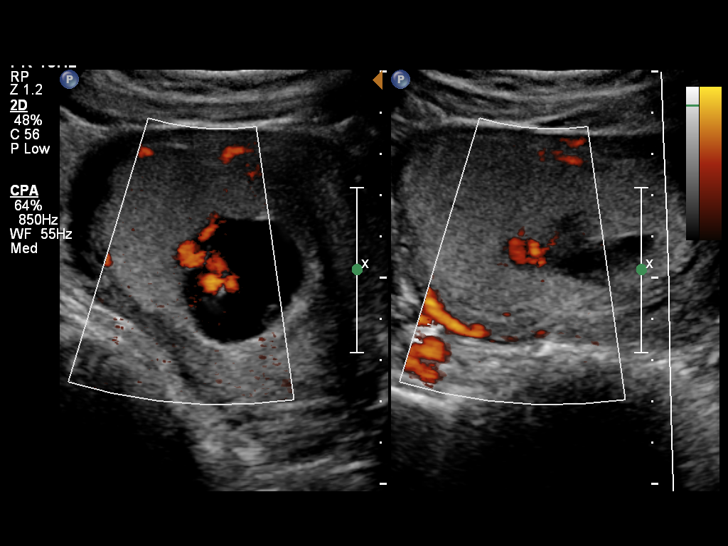
[im 11/27]
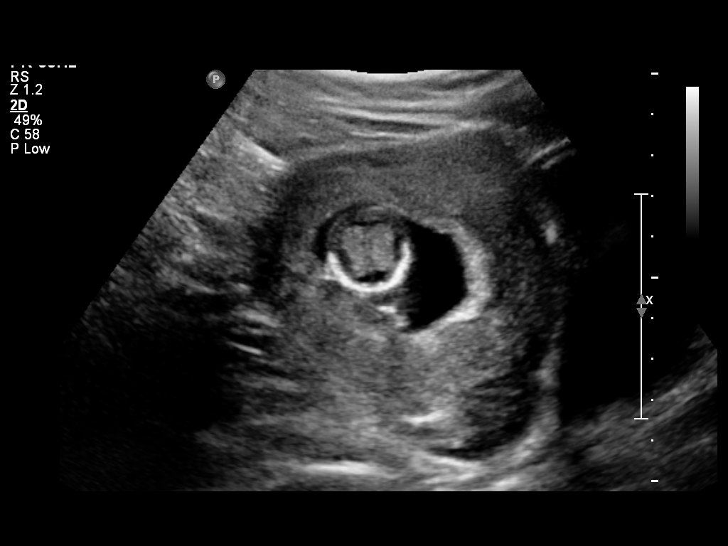
[im 13/27]
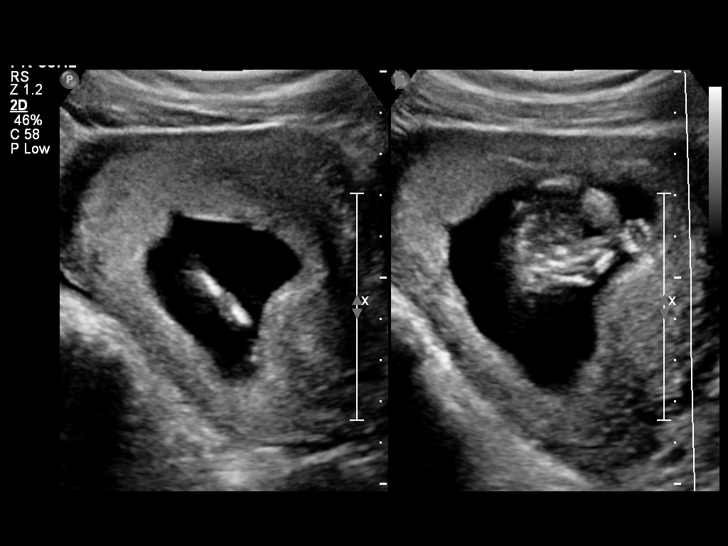
[im 15/27]
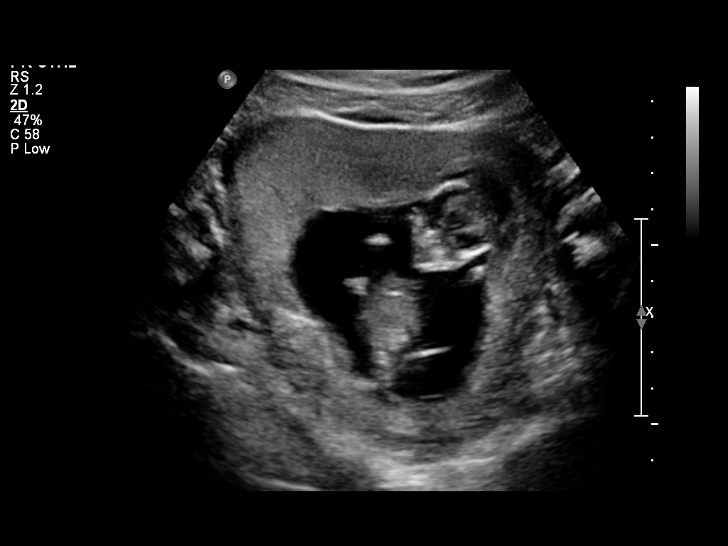
[im 17/27]
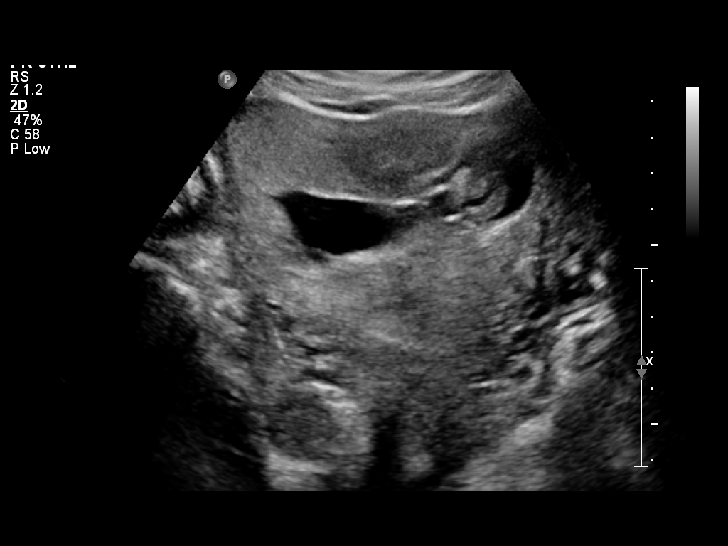
[im 19/27]
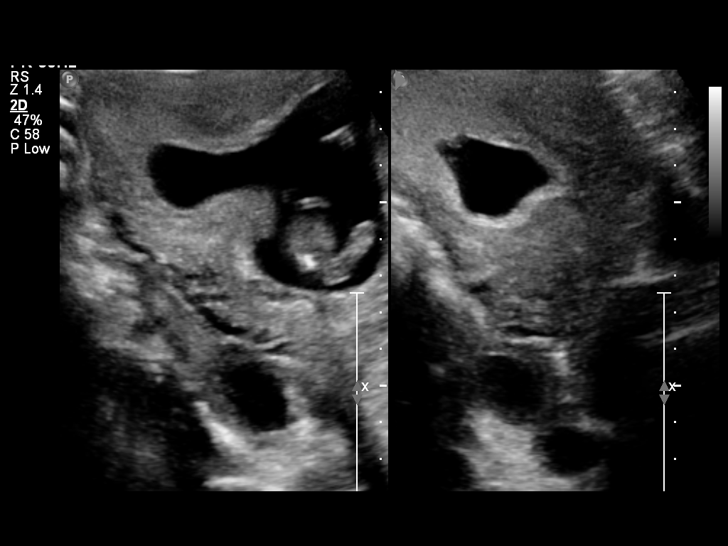
[im 21/27]
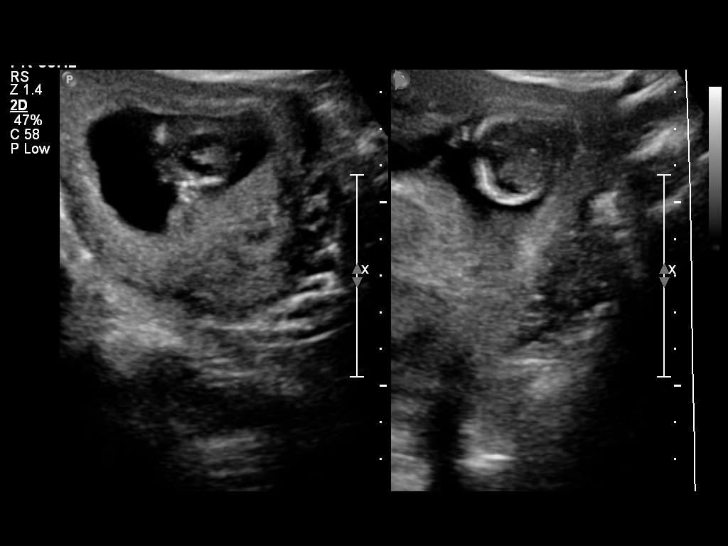
[im 23/27]
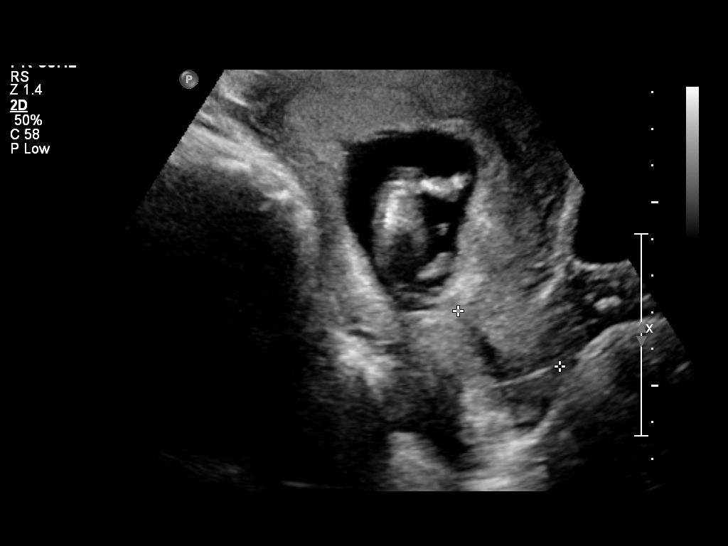
[im 25/27]
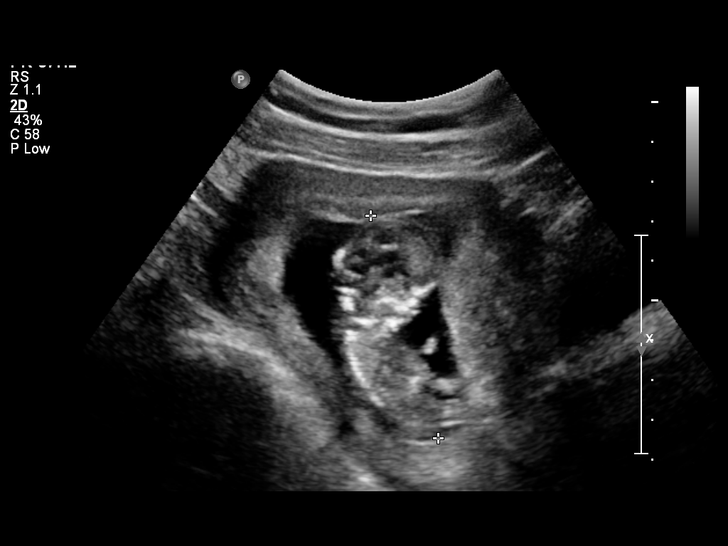
[im 27/27]
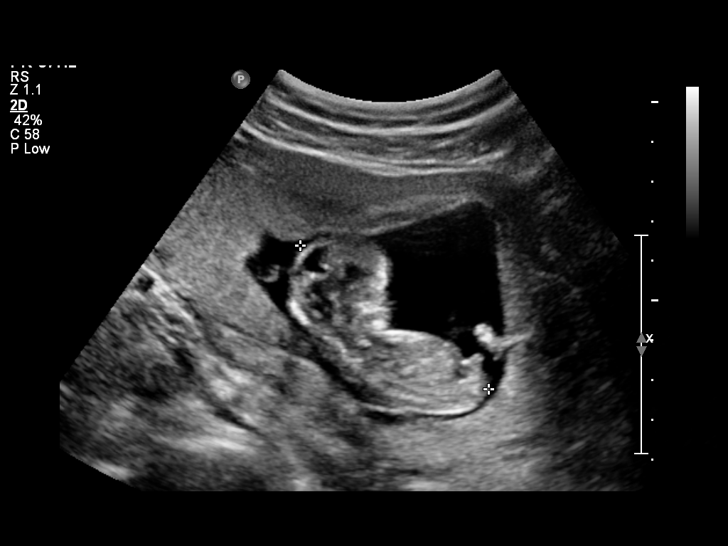

[14 of 27 positions shown; findings below may reference images not displayed]

FINDINGS: Intrauterine gestational sac: Visualized/normal in shape.

Yolk sac:  Visualized

Embryo:  Visualized

Cardiac Activity: Visualized

Heart Rate: 169 bpm

CRL:   59.4  mm   12 w 4 d                  US EDC: 07/07/2014

Maternal uterus/adnexae: Small subchorionic hemorrhage is
visualized, measuring 2.9 x 0.9 x 0.8 cm. This collection appears
slightly smaller than it did on 11/14/2013.
IMPRESSION: 1. Single living intrauterine gestation with a sign gestational age
of 13 weeks 1 day. Estimated gestational age by today's ultrasound
is concordant.
2. Small subchorionic hemorrhage. The subchorionic collection
appears smaller on today's ultrasound than on 11/14/2013.
age is recommended.

## 2017-09-03 ENCOUNTER — Encounter: Payer: Self-pay | Admitting: Allergy and Immunology

## 2017-09-03 ENCOUNTER — Ambulatory Visit (INDEPENDENT_AMBULATORY_CARE_PROVIDER_SITE_OTHER): Payer: Medicaid Other | Admitting: Allergy and Immunology

## 2017-09-03 VITALS — BP 118/72 | HR 90 | Temp 98.0°F | Resp 18 | Ht 61.0 in | Wt 135.0 lb

## 2017-09-03 DIAGNOSIS — J3089 Other allergic rhinitis: Secondary | ICD-10-CM

## 2017-09-03 DIAGNOSIS — G43909 Migraine, unspecified, not intractable, without status migrainosus: Secondary | ICD-10-CM | POA: Diagnosis not present

## 2017-09-03 MED ORDER — FLUTICASONE PROPIONATE 50 MCG/ACT NA SUSP: 1.0000 | Freq: Every day | NASAL | 5 refills | Status: DC

## 2017-09-03 MED ORDER — LORATADINE 10 MG PO TABS: 10.0000 mg | ORAL_TABLET | Freq: Every day | ORAL | 5 refills | Status: DC

## 2017-09-03 MED ORDER — MONTELUKAST SODIUM 10 MG PO TABS: 10.0000 mg | ORAL_TABLET | Freq: Every day | ORAL | 5 refills | Status: DC

## 2017-09-03 MED ORDER — CYPROHEPTADINE HCL 4 MG PO TABS
ORAL_TABLET | ORAL | 5 refills | Status: DC
Start: 2017-09-03 — End: 2017-12-03

## 2017-09-03 NOTE — Patient Instructions (Addendum)
  1.  Allergen avoidance measures  2.  Treat and prevent inflammation:   A.  Montelukast 10 mg tablet 1 time per day  B.  Flonase 1 spray each nostril 1 time per day  3.  Treat and prevent headaches:   A.  Eliminate all caffeine and chocolate consumption  B.  Periactin 4mg  - 1/2 to 1 tablet at bedtime  4.  If needed:   A.  Nasal saline spray  B.  Loratadine 10 mg tablet 1 time per day   5. Return in 6 weeks

## 2017-09-03 NOTE — Progress Notes (Signed)
Dear Edgardo RoysGreta O'Buch,  Thank you for referring Sydney Cons[facility] Niesen to [facility] [place] on 09/03/2017.   Below is a summation of this patient's evaluation and recommendations.  Thank you for your referral. I will keep you informed about this patient's response to treatment.   If you have any questions please do not hesitate to contact me.   Sincerely,  Jessica PriestEric J. Vineeth Fell, MD Allergy / Immunology [facility]   ______________________________________________________________________    NEW PATIENT NOTE  Referring Provider: Eunice Blase'Buch, Greta, PA-C Primary Provider: Eunice Blase'Buch, Greta, PA-C Date of office visit: 09/03/2017    Subjective:   Chief Complaint:  Sydney Crosby (DOB: Mar 20, 1986) is a 31 y.o. female who presents to the clinic on 09/03/2017 with a chief complaint of Eye Problem (pain, puffiness); Migraine (with facial pressure); and Sinus Problem (stuffiness) .     HPI: Babette Relicammy presents to this clinic in evaluation of allergies and headaches.  She has a history of nasal congestion and sneezing and puffy eyes without any anosmia or ugly nasal discharge for which she uses Flonase on a daily basis.  She thinks that her symptoms are somewhat worse during the spring and fall.  It does not sound as though she requires any antibiotics to address this issue.  This is been a long-standing issue for many years duration.  She has migraine headaches manifested as unilateral pain on the side of the head with a pounding quality associated with dizziness and spots and vomiting a few times a week of many years duration.  It appears as though her left parietal side is more involved than her right side.  Provoking factors for this headache include exposure to perfumes and loud music.  She has tried Topamax which has given rise to insomnia and forgetfulness.  She has tried propranolol for the past month which gave her GI upset and  made her sleepy.  She believes that her sleep is okay.  She does have some nasal congestion at nighttime but she does not snore and she does not wake up gasping.  She usually wakes up refreshed in the morning but on a rare occasion will have an unrefreshed night of sleep.  She does intermittently drink caffeine and intermittently eat chocolate.  Past Medical History:  Diagnosis Date  . Medical history non-contributory   . Migraines     Past Surgical History:  Procedure Laterality Date  . CESAREAN SECTION  2015  . NO PAST SURGERIES    . TONSILLECTOMY      Allergies as of 09/03/2017   No Known Allergies     Medication List      acetaminophen 325 MG tablet Commonly known as:  TYLENOL Take 325 mg by mouth every 6 (six) hours as needed for mild pain.   diphenhydrAMINE 25 mg capsule Commonly known as:  BENADRYL Take 25 mg by mouth every 6 (six) hours as needed for allergies.   fluticasone 50 MCG/ACT nasal spray Commonly known as:  FLONASE Place 2 sprays into both nostrils daily.       Review of systems negative except as noted in HPI / PMHx or noted below:  Review of Systems  Constitutional: Negative.   HENT: Negative.   Eyes: Negative.   Respiratory: Negative.   Cardiovascular: Negative.   Gastrointestinal: Negative.   Genitourinary: Negative.   Musculoskeletal: Negative.   Skin: Negative.   Neurological: Negative.   Endo/Heme/Allergies: Negative.  Psychiatric/Behavioral: Negative.     Family History  Problem Relation Age of Onset  . Migraines Mother   . Kidney disease Mother   . Lung disease Father   . Allergic rhinitis Maternal Grandmother     Social History   Socioeconomic History  . Marital status: Legally Separated    Spouse name: Not on file  . Number of children: Not on file  . Years of education: Not on file  . Highest education level: Not on file  Social Needs  . Financial resource strain: Not on file  . Food insecurity - worry: Not on file   . Food insecurity - inability: Not on file  . Transportation needs - medical: Not on file  . Transportation needs - non-medical: Not on file  Occupational History  . Not on file  Tobacco Use  . Smoking status: Never Smoker  . Smokeless tobacco: Never Used  Substance and Sexual Activity  . Alcohol use: No  . Drug use: No  . Sexual activity: Yes    Birth control/protection: None  Other Topics Concern  . Not on file  Social History Narrative  . Not on file    Environmental and Social history  Lives in a house with a dry environment, no animals located inside the household, carpet in the bedroom, plastic on the bed, plastic on the pillow, and no smokers located inside the household.  Objective:   Vitals:   09/03/17 1008  BP: 118/72  Pulse: 90  Resp: 18  Temp: 98 F (36.7 C)   Height: 5\' 1"  (154.9 cm) Weight: 135 lb (61.2 kg)  Physical Exam  Constitutional: She is well-developed, well-nourished, and in no distress.  Allergic shiners  HENT:  Head: Normocephalic. Head is without right periorbital erythema and without left periorbital erythema.  Right Ear: Tympanic membrane, external ear and ear canal normal.  Left Ear: Tympanic membrane, external ear and ear canal normal.  Nose: Nose normal. No mucosal edema or rhinorrhea.  Mouth/Throat: Uvula is midline, oropharynx is clear and moist and mucous membranes are normal. No oropharyngeal exudate.  Eyes: Conjunctivae and lids are normal. Pupils are equal, round, and reactive to light.  Neck: Trachea normal. No tracheal tenderness present. No tracheal deviation present. No thyromegaly present.  Cardiovascular: Normal rate, regular rhythm, S1 normal, S2 normal and normal heart sounds.  No murmur heard. Pulmonary/Chest: Effort normal and breath sounds normal. No stridor. No tachypnea. No respiratory distress. She has no wheezes. She has no rales. She exhibits no tenderness.  Abdominal: Soft. She exhibits no distension and no  mass. There is no hepatosplenomegaly. There is no tenderness. There is no rebound and no guarding.  Musculoskeletal: She exhibits no edema or tenderness.  Lymphadenopathy:       Head (right side): No tonsillar adenopathy present.       Head (left side): No tonsillar adenopathy present.    She has no cervical adenopathy.    She has no axillary adenopathy.  Neurological: She is alert. Gait normal.  Skin: No rash noted. She is not diaphoretic. No erythema. No pallor. Nails show no clubbing.  Psychiatric: Mood and affect normal.    Diagnostics: Allergy skin tests were performed. She demonstrated sensitivity to mold  Assessment and Plan:    1. Other allergic rhinitis   2. Migraine syndrome     1.  Allergen avoidance measures  2.  Treat and prevent inflammation:   A.  Montelukast 10 mg tablet 1 time per day  B.  Flonase 1 spray each nostril 1 time per day  3.  Treat and prevent headaches:   A.  Eliminate all caffeine and chocolate consumption  B.  Periactin 4mg  - 1/2 to 1 tablet at bedtime  4.  If needed:   A.  Nasal saline spray  B.  Loratadine 10 mg tablet 1 time per day   5. Return in 6 weeks  I will make a attempts to address Raye is upper airway inflammation and chronic cephalgia with the therapy noted above and regroup with her in 6 weeks to assess this response and consider further evaluation and treatment based upon this response.  If she still continues with significant upper airway symptoms and headache she will require an imaging procedure of her upper airway.  Jessica PriestEric J. Yahayra Geis, MD Allergy / Immunology [facility] [place]

## 2017-09-04 ENCOUNTER — Encounter: Payer: Self-pay | Admitting: Allergy and Immunology

## 2017-11-15 ENCOUNTER — Encounter: Payer: Self-pay | Admitting: Allergy and Immunology

## 2017-11-15 ENCOUNTER — Ambulatory Visit (INDEPENDENT_AMBULATORY_CARE_PROVIDER_SITE_OTHER): Payer: Medicaid Other | Admitting: Allergy and Immunology

## 2017-11-15 VITALS — BP 138/82 | HR 80 | Resp 16

## 2017-11-15 DIAGNOSIS — G43909 Migraine, unspecified, not intractable, without status migrainosus: Secondary | ICD-10-CM | POA: Diagnosis not present

## 2017-11-15 DIAGNOSIS — J3089 Other allergic rhinitis: Secondary | ICD-10-CM

## 2017-11-15 DIAGNOSIS — J324 Chronic pansinusitis: Secondary | ICD-10-CM | POA: Diagnosis not present

## 2017-11-15 MED ORDER — FLUTICASONE PROPIONATE 50 MCG/ACT NA SUSP: 1.0000 | Freq: Every day | NASAL | 5 refills | Status: DC

## 2017-11-15 MED ORDER — MONTELUKAST SODIUM 10 MG PO TABS: 10.0000 mg | ORAL_TABLET | Freq: Every day | ORAL | 5 refills | Status: AC

## 2017-11-15 MED ORDER — LORATADINE 10 MG PO TABS: 10.0000 mg | ORAL_TABLET | Freq: Every day | ORAL | 5 refills | Status: DC

## 2017-11-15 NOTE — Patient Instructions (Signed)
  1.  Allergen avoidance measures  2.  Treat and prevent inflammation:   A.  Montelukast 10 mg tablet 1 time per day  B.  Flonase 1 spray each nostril 1 time per day  C. Claritin 10mg  tablet 1 time per day  3. Continue to eliminate caffeine and chocolate consumption  4.  If needed:   A.  Nasal saline spray  5. Obtain sinus CT scan  6. [facility]  5. Return in 6 months or earlier if problem

## 2017-11-15 NOTE — Progress Notes (Signed)
Follow-up Note  Referring Provider: Eunice Blase'Buch, Greta, PA-C Primary Provider: Eunice Blase'Buch, Greta, PA-C Date of Office Visit: 11/15/2017  Subjective:   Sydney Crosby (DOB: [birth year]) is a 32 y.o. female who [facility] in re-evaluation of the following:  HPI: [name] returns to this clinic in reevaluation of her airway and headache issue.  She was last seen in this clinic during her initial evaluation of 03 September 2017.  She still continues to have issues with some occasional nasal congestion and sneezing and some nasal pain.  Montelukast and Flonase may help this issue somewhat.  She still continues to have very significant head pain of a pounding quality with dizziness and spots and vomiting a few times per week.  She was unable to tolerate Periactin because of daytime sleepiness.  She has been unable to tolerate Topamax and propranolol in the past.  Allergies as of 11/15/2017   No Known Allergies     Medication List      acetaminophen 325 MG tablet Commonly known as:  TYLENOL Take 325 mg by mouth every 6 (six) hours as needed for mild pain.   cyproheptadine 4 MG tablet Commonly known as:  PERIACTIN Take 1/2 to 1 tablet at bedtime   diphenhydrAMINE 25 mg capsule Commonly known as:  BENADRYL Take 25 mg by mouth every 6 (six) hours as needed for allergies.   fluticasone 50 MCG/ACT nasal spray Commonly known as:  FLONASE Place 1 spray into both nostrils daily.   loratadine 10 MG tablet Commonly known as:  CLARITIN Take 1 tablet (10 mg total) by mouth daily.   montelukast 10 MG tablet Commonly known as:  SINGULAIR Take 1 tablet (10 mg total) by mouth at bedtime.       Past Medical History:  Diagnosis Date  . Allergic rhinitis   . Migraines     Past Surgical History:  Procedure Laterality Date  . CESAREAN SECTION  2015  . TONSILLECTOMY      Review of systems negative except as noted in HPI / PMHx or noted  below:  Review of Systems  Constitutional: Negative.   HENT: Negative.   Eyes: Negative.   Respiratory: Negative.   Cardiovascular: Negative.   Gastrointestinal: Negative.   Genitourinary: Negative.   Musculoskeletal: Negative.   Skin: Negative.   Neurological: Negative.   Endo/Heme/Allergies: Negative.   Psychiatric/Behavioral: Negative.      Objective:   Vitals:   11/15/17 1538  BP: 138/82  Pulse: 80  Resp: 16          Physical Exam  Constitutional: She is well-developed, well-nourished, and in no distress.  HENT:  Head: Normocephalic.  Right Ear: Tympanic membrane, external ear and ear canal normal.  Left Ear: Tympanic membrane, external ear and ear canal normal.  Nose: Nose normal. No mucosal edema or rhinorrhea.  Mouth/Throat: Uvula is midline, oropharynx is clear and moist and mucous membranes are normal. No oropharyngeal exudate.  Eyes: Conjunctivae are normal.  Neck: Trachea normal. No tracheal tenderness present. No tracheal deviation present. No thyromegaly present.  Cardiovascular: Normal rate, regular rhythm, S1 normal, S2 normal and normal heart sounds.  No murmur heard. Pulmonary/Chest: Breath sounds normal. No stridor. No respiratory distress. She has no wheezes. She has no rales.  Musculoskeletal: She exhibits no edema.  Lymphadenopathy:       Head (right side): No tonsillar adenopathy present.       Head (left side): No tonsillar adenopathy present.  She has no cervical adenopathy.  Neurological: She is alert. Gait normal.  Skin: No rash noted. She is not diaphoretic. No erythema. Nails show no clubbing.  Psychiatric: Mood and affect normal.    Diagnostics: none  Assessment and Plan:   1. Other allergic rhinitis   2. Migraine syndrome   3. Chronic pansinusitis     1.  Allergen avoidance measures  2.  Treat and prevent inflammation:   A.  Montelukast 10 mg tablet 1 time per day  B.  Flonase 1 spray each nostril 1 time per day  C.  Claritin 10mg  tablet 1 time per day  3. Continue to eliminate caffeine and chocolate consumption  4.  If needed:   A.  Nasal saline spray  5. Obtain sinus CT scan  6. [facility]  5. Return in 6 months or earlier if problem  I think we are obligated to rule out chronic sinusitis as a cause for [name]'s airway and cephalgia issue and we will get a sinus CT scan arranged sometime in the near future.  She appears to have complex headaches that have been unresponsive to Topamax, propranolol, and Periactin and she really needs to be evaluated by a headache specialist.  We will see if we can get that arranged sometime in the near future.  She will continue to utilize anti-inflammatory agents for her respiratory tract as noted above.  [name], MD Allergy / Immunology [facility]

## 2017-11-19 ENCOUNTER — Encounter: Payer: Self-pay | Admitting: Allergy and Immunology

## 2017-11-20 ENCOUNTER — Telehealth: Payer: Self-pay | Admitting: *Deleted

## 2017-11-20 NOTE — Telephone Encounter (Signed)
[facility] scheduled [facility] at 10:00 arriving at 9:30. Patient informed. Order faxed.

## 2017-11-26 ENCOUNTER — Telehealth: Payer: Self-pay | Admitting: Allergy and Immunology

## 2017-11-26 NOTE — Telephone Encounter (Signed)
Patient informed of normal sinus CT results.

## 2017-11-26 NOTE — Telephone Encounter (Signed)
Sydney Crosby called in and stated she would like the results of her CT Scan she received on Friday.  I informed her results were not in and Sydney Crosby stated "they" told her to call Monday morning.  I informed her they typically fax the results to us but I would send a message anyway.

## 2017-12-03 ENCOUNTER — Ambulatory Visit: Payer: Medicaid Other | Admitting: Neurology

## 2017-12-03 ENCOUNTER — Encounter: Payer: Self-pay | Admitting: Neurology

## 2017-12-03 VITALS — BP 125/84 | HR 88 | Ht 61.0 in | Wt 134.8 lb

## 2017-12-03 DIAGNOSIS — IMO0002 Reserved for concepts with insufficient information to code with codable children: Secondary | ICD-10-CM | POA: Insufficient documentation

## 2017-12-03 DIAGNOSIS — G43709 Chronic migraine without aura, not intractable, without status migrainosus: Secondary | ICD-10-CM | POA: Diagnosis not present

## 2017-12-03 MED ORDER — RIZATRIPTAN BENZOATE 10 MG PO TBDP: 10.0000 mg | ORAL_TABLET | ORAL | 6 refills | Status: DC | PRN

## 2017-12-03 MED ORDER — ONDANSETRON 4 MG PO TBDP: 4.0000 mg | ORAL_TABLET | Freq: Three times a day (TID) | ORAL | 6 refills | Status: DC | PRN

## 2017-12-03 MED ORDER — CYCLOBENZAPRINE HCL 10 MG PO TABS: 10.0000 mg | ORAL_TABLET | Freq: Three times a day (TID) | ORAL | 5 refills | Status: DC | PRN

## 2017-12-03 MED ORDER — VENLAFAXINE HCL ER 37.5 MG PO CP24: 75.0000 mg | ORAL_CAPSULE | Freq: Every day | ORAL | 11 refills | Status: DC

## 2017-12-03 NOTE — Progress Notes (Addendum)
PATIENT: Sydney Crosby DOB: 10/01/1985  Chief Complaint  Patient presents with  . Migraine    Reports history of migraines since her teenage years.  Her headaches are associated with nausea, vomiting and noise/light sensitivity.  She has recently taken Topamax but could not tolerate the side effects (foggy minded).  She has also tried propranolol (caused stomach upset) and tizanidine at bedtime (felt too drowsy the following morning).  Imitrex caused blurred vision.  She has a two young children at home and feels migraines are interfering with her time with them.  . PCP    O'Buch, Greta, PA-C     HISTORICAL  Sydney Crosby is a 32 year old female, seen in refer by her primary care PA O'Buch, Edgardo Roys, for evaluation of frequent migraine headache, initial evaluation was on December 03, 2017.  She reported history of migraine headaches since she was 32 years old, only happens occasionally, began to have gradual worsening migraine headaches since age 49, this was following her contraceptive treatment, especially since her recent childbirth in 2015, she complains of worsening headaches, every other days, using frequent over-the-counter herb oil, sometimes Tylenol, Excedrin migraines with limited help,  Previously she was giving a prescription of Topamax, which has helped her headache, but she complains of intolerable side effect of paresthesia, mental slowing, propanolol, upset stomach, nortriptyline, unsure benefit,  She has tried Imitrex, cause blurry vision.   Her typical migraine left side severe pounding headache with associated light noise sensitivity, nauseous, lasting for hours, 2 days, Aleve as needed seems to help her headaches.  Laboratory evaluations on November 2019, normal CBC hemoglobin of 12.4, CMP, creatinine of 0.75, LDL of 164, normal TSH  REVIEW OF SYSTEMS: Full 14 system review of systems performed and notable only for dizziness, headaches, sleepiness, anxiety, constipation,  skin sensitivity, fatigue  ALLERGIES: No Known Allergies  HOME MEDICATIONS: Current Outpatient Medications  Medication Sig Dispense Refill  . acetaminophen (TYLENOL) 325 MG tablet Take 325 mg by mouth every 6 (six) hours as needed for mild pain.    . diphenhydrAMINE (BENADRYL) 25 mg capsule Take 25 mg by mouth every 6 (six) hours as needed for allergies.    . fluticasone (FLONASE) 50 MCG/ACT nasal spray Place 1 spray into both nostrils daily. 16 g 5  . loratadine (CLARITIN) 10 MG tablet Take 1 tablet (10 mg total) by mouth daily. 30 tablet 5  . montelukast (SINGULAIR) 10 MG tablet Take 1 tablet (10 mg total) by mouth at bedtime. 30 tablet 5  . Multiple Vitamins-Minerals (MULTIVITAMIN PO) Take by mouth daily.     No current facility-administered medications for this visit.     PAST MEDICAL HISTORY: Past Medical History:  Diagnosis Date  . Allergic rhinitis   . Migraines     PAST SURGICAL HISTORY: Past Surgical History:  Procedure Laterality Date  . CESAREAN SECTION  2015  . TONSILLECTOMY      FAMILY HISTORY: Family History  Problem Relation Age of Onset  . Migraines Mother   . Kidney disease Mother   . Stroke Mother   . Hypercholesterolemia Mother   . Hypertension Mother   . Lung disease Father   . Seizures Father   . Allergic rhinitis Maternal Grandmother   . Heart attack Maternal Aunt   . Migraines Maternal Aunt   . Migraines Maternal Aunt   . Migraines Maternal Uncle   . Alzheimer's disease Paternal Grandmother     SOCIAL HISTORY:  Social History   Socioeconomic History  .  Marital status: Legally Separated    Spouse name: Not on file  . Number of children: 2  . Years of education: some college  . Highest education level: Not on file  Social Needs  . Financial resource strain: Not on file  . Food insecurity - worry: Not on file  . Food insecurity - inability: Not on file  . Transportation needs - medical: Not on file  . Transportation needs -  non-medical: Not on file  Occupational History  . Occupation: Homemaker  Tobacco Use  . Smoking status: Never Smoker  . Smokeless tobacco: Never Used  Substance and Sexual Activity  . Alcohol use: No  . Drug use: No  . Sexual activity: Yes    Birth control/protection: None  Other Topics Concern  . Not on file  Social History Narrative   Lives at home with mother, daughter and son.   Right-handed.   Occasional soda.     PHYSICAL EXAM   Vitals:   12/03/17 1513  BP: 125/84  Pulse: 88  Weight: 134 lb 12 oz (61.1 kg)  Height: 5\' 1"  (1.549 m)    Not recorded      Body mass index is 25.46 kg/m.  PHYSICAL EXAMNIATION:  Gen: NAD, conversant, well nourised, obese, well groomed                     Cardiovascular: Regular rate rhythm, no peripheral edema, warm, nontender. Eyes: Conjunctivae clear without exudates or hemorrhage Neck: Supple, no carotid bruits. Pulmonary: Clear to auscultation bilaterally   NEUROLOGICAL EXAM:  MENTAL STATUS: Speech:    Speech is normal; fluent and spontaneous with normal comprehension.  Cognition:     Orientation to time, place and person     Normal recent and remote memory     Normal Attention span and concentration     Normal Language, naming, repeating,spontaneous speech     Fund of knowledge   CRANIAL NERVES: CN II: Visual fields are full to confrontation. Fundoscopic exam is normal with sharp discs and no vascular changes. Pupils are round equal and briskly reactive to light. CN III, IV, VI: extraocular movement are normal. No ptosis. CN V: Facial sensation is intact to pinprick in all 3 divisions bilaterally. Corneal responses are intact.  CN VII: Face is symmetric with normal eye closure and smile. CN VIII: Hearing is normal to rubbing fingers CN IX, X: Palate elevates symmetrically. Phonation is normal. CN XI: Head turning and shoulder shrug are intact CN XII: Tongue is midline with normal movements and no  atrophy.  MOTOR: There is no pronator drift of out-stretched arms. Muscle bulk and tone are normal. Muscle strength is normal.  REFLEXES: Reflexes are 2+ and symmetric at the biceps, triceps, knees, and ankles. Plantar responses are flexor.  SENSORY: Intact to light touch, pinprick, positional sensation and vibratory sensation are intact in fingers and toes.  COORDINATION: Rapid alternating movements and fine finger movements are intact. There is no dysmetria on finger-to-nose and heel-knee-shin.    GAIT/STANCE: Posture is normal. Gait is steady with normal steps, base, arm swing, and turning. Heel and toe walking are normal. Tandem gait is normal.  Romberg is absent.   DIAGNOSTIC DATA (LABS, IMAGING, TESTING) - I reviewed patient records, labs, notes, testing and imaging myself where available.   ASSESSMENT AND PLAN  Sydney Crosby is a 32 y.o. female   Chronic migraine headaches  Try preventive medications Effexor 37.5 mg titrating to 2 tablets every night  Maxalt as needed, combine it together with Zofran, Aleve, and Flexeril,  Stop frequent over-the-counter medication use     Levert Feinstein, M.D. Ph.D.  [facility] [address] [place], [place] [address] Ph: [phone] Fax: [phone]  CC: Eunice Blase, [place]

## 2017-12-04 ENCOUNTER — Other Ambulatory Visit: Payer: Self-pay | Admitting: *Deleted

## 2017-12-04 MED ORDER — RIZATRIPTAN BENZOATE 10 MG PO TBDP: ORAL_TABLET | ORAL | 6 refills | Status: DC

## 2018-03-07 ENCOUNTER — Other Ambulatory Visit: Payer: Self-pay | Admitting: *Deleted

## 2018-03-07 ENCOUNTER — Ambulatory Visit: Payer: Medicaid Other | Admitting: Neurology

## 2018-03-07 ENCOUNTER — Encounter: Payer: Self-pay | Admitting: Neurology

## 2018-03-07 ENCOUNTER — Telehealth: Payer: Self-pay | Admitting: Neurology

## 2018-03-07 VITALS — BP 122/68 | HR 84 | Ht 61.0 in | Wt 137.0 lb

## 2018-03-07 DIAGNOSIS — G43709 Chronic migraine without aura, not intractable, without status migrainosus: Secondary | ICD-10-CM | POA: Diagnosis not present

## 2018-03-07 DIAGNOSIS — IMO0002 Reserved for concepts with insufficient information to code with codable children: Secondary | ICD-10-CM

## 2018-03-07 MED ORDER — ONDANSETRON 4 MG PO TBDP: 4.0000 mg | ORAL_TABLET | Freq: Three times a day (TID) | ORAL | 11 refills | Status: AC | PRN

## 2018-03-07 MED ORDER — SUMATRIPTAN SUCCINATE 6 MG/0.5ML ~~LOC~~ SOLN: SUBCUTANEOUS | 11 refills | Status: DC

## 2018-03-07 MED ORDER — FREMANEZUMAB-VFRM 225 MG/1.5ML ~~LOC~~ SOSY: 225.0000 mg | PREFILLED_SYRINGE | SUBCUTANEOUS | 11 refills | Status: DC

## 2018-03-07 MED ORDER — TOPIRAMATE 100 MG PO TABS: 100.0000 mg | ORAL_TABLET | Freq: Two times a day (BID) | ORAL | 11 refills | Status: AC

## 2018-03-07 MED ORDER — SUMATRIPTAN SUCCINATE 6 MG/0.5ML ~~LOC~~ SOLN: 6.0000 mg | SUBCUTANEOUS | 11 refills | Status: DC | PRN

## 2018-03-07 NOTE — Progress Notes (Signed)
PATIENT: Sydney Crosby DOB: Mar 24, 1986  Chief Complaint  Patient presents with  . Migraine    She is still having two migraine days per week despite taking Effexor XR 75mg  in the morning.  She felt better with Topamax.  She is asking if she would be a candidate for Botox. She does not like to use Maxalt because it makes her sleepy.  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     HISTORICAL  Babette Relicammy Cecelia ByarsBarham is a 32 year old female, seen in refer by her primary care PA O'Buch, Edgardo RoysGreta, for evaluation of frequent migraine headache, initial evaluation was on December 03, 2017.  She reported history of migraine headaches since she was 32 years old, only happens occasionally, began to have gradual worsening migraine headaches since age 32, this was following her contraceptive treatment, especially since her recent childbirth in 2015, she complains of worsening headaches, every other days, using frequent over-the-counter herb oil, sometimes Tylenol, Excedrin migraines with limited help,  Previously she was giving a prescription of Topamax, which has helped her headache, but she complains of intolerable side effect of paresthesia, mental slowing, propanolol, upset stomach, nortriptyline, unsure benefit,  She has tried Imitrex, cause blurry vision.   Her typical migraine left side severe pounding headache with associated light noise sensitivity, nauseous, lasting for hours, 2 days, Aleve as needed seems to help her headaches.  Laboratory evaluations on November 2019, normal CBC hemoglobin of 12.4, CMP, creatinine of 0.75, LDL of 164, normal TSH  UPDATE March 07 2018: She has tried Effexor for 2 months, without significant improvement of her headaches, she stays home for her children at age 89, 78, is very frustrated about her frequent headaches at least twice a week, Zofran was helpful, but Maxalt was not helpful,  Her aunt has good response  to Botox injection as migraine prevention, that is what she wants to,  She has frequent migraine headaches, lateralized severe pounding headaches light noise sensitive, nauseous, lasting for more than 4 hours, sometimes 2 days,    REVIEW OF SYSTEMS: Full 14 system review of systems performed and notable only for dizziness, headaches, sleepiness, anxiety, constipation, skin sensitivity, fatigue  ALLERGIES: No Known Allergies  HOME MEDICATIONS: Current Outpatient Medications  Medication Sig Dispense Refill  . acetaminophen (TYLENOL) 325 MG tablet Take 325 mg by mouth every 6 (six) hours as needed for mild pain.    . cyclobenzaprine (FLEXERIL) 10 MG tablet Take 1 tablet (10 mg total) by mouth 3 (three) times daily as needed for muscle spasms. 30 tablet 5  . diphenhydrAMINE (BENADRYL) 25 mg capsule Take 25 mg by mouth every 6 (six) hours as needed for allergies.    . fluticasone (FLONASE) 50 MCG/ACT nasal spray Place 1 spray into both nostrils daily. 16 g 5  . loratadine (CLARITIN) 10 MG tablet Take 1 tablet (10 mg total) by mouth daily. 30 tablet 5  . montelukast (SINGULAIR) 10 MG tablet Take 1 tablet (10 mg total) by mouth at bedtime. 30 tablet 5  . Multiple Vitamins-Minerals (MULTIVITAMIN PO) Take by mouth daily.    . ondansetron (ZOFRAN ODT) 4 MG disintegrating tablet Take 1 tablet (4 mg total) by mouth every 8 (eight) hours as needed. 20 tablet 6  . rizatriptan (MAXALT-MLT) 10 MG disintegrating tablet Take 1 tab at onset of migraine.  May repeat in 2 hrs, if needed.  Max dose: 2 tabs/day. This is a 30 day  prescription. 12 tablet 6  . venlafaxine XR (EFFEXOR XR) 37.5 MG 24 hr capsule Take 2 capsules (75 mg total) by mouth daily with breakfast. 60 capsule 11   No current facility-administered medications for this visit.     PAST MEDICAL HISTORY: Past Medical History:  Diagnosis Date  . Allergic rhinitis   . Migraines     PAST SURGICAL HISTORY: Past Surgical History:  Procedure  Laterality Date  . CESAREAN SECTION  2015  . TONSILLECTOMY      FAMILY HISTORY: Family History  Problem Relation Age of Onset  . Migraines Mother   . Kidney disease Mother   . Stroke Mother   . Hypercholesterolemia Mother   . Hypertension Mother   . Lung disease Father   . Seizures Father   . Allergic rhinitis Maternal Grandmother   . Heart attack Maternal Aunt   . Migraines Maternal Aunt   . Migraines Maternal Aunt   . Migraines Maternal Uncle   . Alzheimer's disease Paternal Grandmother     SOCIAL HISTORY:  Social History   Socioeconomic History  . Marital status: Legally Separated    Spouse name: Not on file  . Number of children: 2  . Years of education: some college  . Highest education level: Not on file  Occupational History  . Occupation: Futures trader  Social Needs  . Financial resource strain: Not on file  . Food insecurity:    Worry: Not on file    Inability: Not on file  . Transportation needs:    Medical: Not on file    Non-medical: Not on file  Tobacco Use  . Smoking status: Never Smoker  . Smokeless tobacco: Never Used  Substance and Sexual Activity  . Alcohol use: No  . Drug use: No  . Sexual activity: Yes    Birth control/protection: None  Lifestyle  . Physical activity:    Days per week: Not on file    Minutes per session: Not on file  . Stress: Not on file  Relationships  . Social connections:    Talks on phone: Not on file    Gets together: Not on file    Attends religious service: Not on file    Active member of club or organization: Not on file    Attends meetings of clubs or organizations: Not on file    Relationship status: Not on file  . Intimate partner violence:    Fear of current or ex partner: Not on file    Emotionally abused: Not on file    Physically abused: Not on file    Forced sexual activity: Not on file  Other Topics Concern  . Not on file  Social History Narrative   Lives at home with mother, daughter and son.     Right-handed.   Occasional soda.     PHYSICAL EXAM   Vitals:   03/07/18 1143  BP: 122/68  Pulse: 84  Weight: 137 lb (62.1 kg)  Height: 5\' 1"  (1.549 m)    Not recorded      Body mass index is 25.89 kg/m.  PHYSICAL EXAMNIATION:  Gen: NAD, conversant, well nourised, obese, well groomed                     Cardiovascular: Regular rate rhythm, no peripheral edema, warm, nontender. Eyes: Conjunctivae clear without exudates or hemorrhage Neck: Supple, no carotid bruits. Pulmonary: Clear to auscultation bilaterally   NEUROLOGICAL EXAM:  MENTAL STATUS: Speech:  Speech is normal; fluent and spontaneous with normal comprehension.  Cognition:     Orientation to time, place and person     Normal recent and remote memory     Normal Attention span and concentration     Normal Language, naming, repeating,spontaneous speech     Fund of knowledge   CRANIAL NERVES: CN II: Visual fields are full to confrontation. Fundoscopic exam is normal with sharp discs and no vascular changes. Pupils are round equal and briskly reactive to light. CN III, IV, VI: extraocular movement are normal. No ptosis. CN V: Facial sensation is intact to pinprick in all 3 divisions bilaterally. Corneal responses are intact.  CN VII: Face is symmetric with normal eye closure and smile. CN VIII: Hearing is normal to rubbing fingers CN IX, X: Palate elevates symmetrically. Phonation is normal. CN XI: Head turning and shoulder shrug are intact CN XII: Tongue is midline with normal movements and no atrophy.  MOTOR: There is no pronator drift of out-stretched arms. Muscle bulk and tone are normal. Muscle strength is normal.  REFLEXES: Reflexes are 2+ and symmetric at the biceps, triceps, knees, and ankles. Plantar responses are flexor.  SENSORY: Intact to light touch, pinprick, positional sensation and vibratory sensation are intact in fingers and toes.  COORDINATION: Rapid alternating movements and  fine finger movements are intact. There is no dysmetria on finger-to-nose and heel-knee-shin.    GAIT/STANCE: Posture is normal. Gait is steady with normal steps, base, arm swing, and turning. Heel and toe walking are normal. Tandem gait is normal.  Romberg is absent.   DIAGNOSTIC DATA (LABS, IMAGING, TESTING) - I reviewed patient records, labs, notes, testing and imaging myself where available.   ASSESSMENT AND PLAN  Janeice Harder is a 32 y.o. female   Chronic migraine headaches  Try and failed preventive medications Effexor 75 mg  Would want to retry Topamax 100 mg twice a day  Botox injection preauthorization as migraine prevention  Imitrex subcutaneous injection,  May combine it together with Zofran, Aleve, and Flexeril,  Stop frequent over-the-counter medication use     Levert Feinstein, M.D. Ph.D.  [facility] [address], [address] [place], [place] [address] Ph: [phone] Fax: [phone]  CC: Eunice Blase, [place]

## 2018-03-07 NOTE — Telephone Encounter (Signed)
1 month btx °

## 2018-03-11 NOTE — Telephone Encounter (Signed)
I called and scheduled the patient for injection. Medicaid and [facility] paper work given to Dr. Terrace ArabiaYan for signature.

## 2018-03-12 ENCOUNTER — Telehealth: Payer: Self-pay | Admitting: Neurology

## 2018-03-12 NOTE — Telephone Encounter (Signed)
It's documented under the update on 03/07/18 (frequent headaches, at least twice weekly, lasting four hours to two days).

## 2018-03-12 NOTE — Telephone Encounter (Signed)
Patient calling to discuss her migraines and being real anxious. Is there something she can take for anxiety and still be able to function? Please call to discuss. She uses [facility] in [place].

## 2018-03-12 NOTE — Telephone Encounter (Signed)
Sydney Crosby, am I missing something with this patient, I cant find her total head ache days a month?

## 2018-03-12 NOTE — Telephone Encounter (Signed)
Spoke to patient who reported having difficulty with anxiety and feels she needs to be started on medication.  She was instructed to contact her PCP for a formal evaluation of this concern so that the appropriate treatment can be initiated.  She verbalized understanding and was in agreement with this plan.  She still intends to move forward with Botox to treat her migraines.

## 2018-03-13 NOTE — Telephone Encounter (Signed)
Paperwork submitted to [facility] [employer] and [facility].  °

## 2018-03-25 ENCOUNTER — Telehealth: Payer: Self-pay | Admitting: Neurology

## 2018-03-25 NOTE — Telephone Encounter (Signed)
Medicaid has denied the Botox for the patient. I called and spoke with the patient regarding this. Medicaid denied it because the patient has not tried and failed three drug classes. The patient thinks she has tried all of these drug classes though so she is not sure why she is being denied. I asked her if she informed Dr. Terrace ArabiaYan of all the previously tried medication but she did not recall. She would like to know what do from this point. We could apply again but we will have to have all of her previously tried and failed medications.  Marcelino DusterMichelle, please advise how we should proceed.

## 2018-03-25 NOTE — Telephone Encounter (Signed)
Patient has appointment with Dr. Terrace ArabiaYan for Botox on 04-24-18. [facility] will not [facility].

## 2018-03-25 NOTE — Telephone Encounter (Signed)
She has tried the following: venlafaxine, topiramate, propanolol, nortriptyline.

## 2018-03-28 NOTE — Telephone Encounter (Signed)
New authorization form has been filled out and these medications have been added. Awaiting signature from Dr. Terrace ArabiaYan.

## 2018-04-01 NOTE — Telephone Encounter (Signed)
New authorization forms have been [facility].

## 2018-04-09 ENCOUNTER — Telehealth: Payer: Self-pay | Admitting: *Deleted

## 2018-04-09 NOTE — Telephone Encounter (Signed)
Left message for patient to call the office

## 2018-04-09 NOTE — Telephone Encounter (Signed)
Left another message.

## 2018-04-09 NOTE — Telephone Encounter (Addendum)
Sydney Crosby states that she is having sinus issues. She has lots of sinus pressure and facial pain as well as congestion and a headache. She has tried Sudafed and is taking her Montelukast, Flonse, and Claritin as directed. OTC pain meds as well as the medications prescribed by [facility] are not helping.  Please advise.   [phone]

## 2018-04-09 NOTE — Telephone Encounter (Signed)
Please inform patient that we will treat her for a sinus infection with Augmentin 875 one tablet two times per day for 10 days plus prednisone 10mg  one tablet one time per day for 10 days.

## 2018-04-10 NOTE — Telephone Encounter (Signed)
Left message to call office

## 2018-04-11 NOTE — Telephone Encounter (Signed)
Pt called, advised she will need to call [facility] for consent of the medication. Pt would like a call back as well, stating she is nervous and wanting to go into more detail

## 2018-04-11 NOTE — Telephone Encounter (Signed)
I called [facility] to schedule delivery on the patients Botox medication. They are pending patient consent.   I called the patient to make her aware but she did not answer so I left her a VM asking her to call back.

## 2018-04-11 NOTE — Telephone Encounter (Signed)
Spoke to patient - she just had a few questions about the Botox procedure that were answered.  She needs the phone number to [facility] to call so she can give consent.

## 2018-04-12 NOTE — Telephone Encounter (Signed)
I called the patient back, she did not answer so I left a VM just stating the number to the pharmacy. [phone].

## 2018-04-15 NOTE — Telephone Encounter (Signed)
Patient never called back after several messages

## 2018-04-16 ENCOUNTER — Telehealth: Payer: Self-pay | Admitting: Allergy and Immunology

## 2018-04-16 NOTE — Telephone Encounter (Signed)
Please inform patient that she has already received a steroid with the injection from her PCP. Use lots of nasal saline and come see me Wednesday or Thursday if still with problems.

## 2018-04-16 NOTE — Telephone Encounter (Signed)
I called [facility] and they stated that it was still pending insurance. I asked for an email to be sent over asap. Patient has still not called to do enrollment.

## 2018-04-16 NOTE — Telephone Encounter (Signed)
Calley called in and stated that about 2 Saturdays ago she started getting a sinus infection.  Donte went to her PCP last Tuesday and received a Kenalog injection and Augmentin.  Debi's PCP told her to take the Augmentin for 5 days but if she felt better to stop at 3 days.  Errika stopped at 3 days but states she has a lot of pressure in her face.  Addalynne was wondering if something can be called in but also wondered if she could get Prednisone.  Please advise.

## 2018-04-17 NOTE — Telephone Encounter (Signed)
Left a message for pt to call office back.  °

## 2018-04-17 NOTE — Telephone Encounter (Signed)
We will see Sydney Crosby tomorrow at 8:30.

## 2018-04-18 ENCOUNTER — Encounter: Payer: Self-pay | Admitting: Allergy and Immunology

## 2018-04-18 ENCOUNTER — Ambulatory Visit (INDEPENDENT_AMBULATORY_CARE_PROVIDER_SITE_OTHER): Payer: Medicaid Other | Admitting: Allergy and Immunology

## 2018-04-18 VITALS — BP 138/102 | HR 76 | Resp 16

## 2018-04-18 DIAGNOSIS — G43909 Migraine, unspecified, not intractable, without status migrainosus: Secondary | ICD-10-CM

## 2018-04-18 DIAGNOSIS — J3089 Other allergic rhinitis: Secondary | ICD-10-CM

## 2018-04-18 NOTE — Patient Instructions (Addendum)
  1.  Continue to perform Allergen avoidance measures  2.  Continue to Treat and prevent inflammation:   A.  Montelukast 10 mg tablet 1 time per day  B.  Flonase 1 spray each nostril 1 time per day  C.  Claritin 10mg  tablet 1 time per day  D.  Prednisone 10mg  1 tablet one time per day for 10 days only  3. Continue to eliminate caffeine and chocolate consumption  4.  If needed:   A.  Nasal saline spray  5.  Return in 6 months or earlier if problem  6. Obtain fall flu vaccine

## 2018-04-18 NOTE — Progress Notes (Signed)
Follow-up Note  Referring Provider: Eunice Crosby, Greta, PA-C Primary Provider: Eunice Crosby, Greta, PA-C Date of Office Visit: 04/18/2018  Subjective:   Sydney Crosby (DOB: 1986-09-16) is a 32 y.o. female who [facility] in re-evaluation of the following:  HPI: Sydney Crosby presents to this clinic in reevaluation of allergic rhinitis and headaches.  I last saw her in his clinic on 15 November 2017.  She was actually doing quite well regarding all of her airway and headache issues but unfortunately while [place] home from [place] on [place] June she developed acute onset of nasal congestion and head pressure for which she went to see her primary care doctor who gave her a Kenalog injection and then a course of Augmentin.  She only used 3 days of Augmentin.  She has not had any fever or ugly nasal discharge or anosmia but she has a tremendous amount of sinus pressure especially on the right side of her face.  Allergies as of 04/18/2018   No Known Allergies     Medication List      diphenhydrAMINE 25 mg capsule Commonly known as:  BENADRYL Take 25 mg by mouth every 6 (six) hours as needed for allergies.   fluticasone 50 MCG/ACT nasal spray Commonly known as:  FLONASE Place 1 spray into both nostrils daily.   loratadine 10 MG tablet Commonly known as:  CLARITIN Take 1 tablet (10 mg total) by mouth daily.   montelukast 10 MG tablet Commonly known as:  SINGULAIR Take 1 tablet (10 mg total) by mouth at bedtime.   MULTIVITAMIN PO Take by mouth daily.   ondansetron 4 MG disintegrating tablet Commonly known as:  ZOFRAN ODT Take 1 tablet (4 mg total) by mouth every 8 (eight) hours as needed.   SUMAtriptan 6 MG/0.5ML Soln injection Commonly known as:  IMITREX Inject 6mg  at onset of migraine.  May repeat in 2 hrs, if needed.  Max dose: 2 injections/day. This is a 30 day prescription.   topiramate 100 MG tablet Commonly known as:  TOPAMAX Take 1 tablet  (100 mg total) by mouth 2 (two) times daily.       Past Medical History:  Diagnosis Date  . Allergic rhinitis   . Migraines     Past Surgical History:  Procedure Laterality Date  . CESAREAN SECTION  2015  . TONSILLECTOMY      Review of systems negative except as noted in HPI / PMHx or noted below:  Review of Systems  Constitutional: Negative.   HENT: Negative.   Eyes: Negative.   Respiratory: Negative.   Cardiovascular: Negative.   Gastrointestinal: Negative.   Genitourinary: Negative.   Musculoskeletal: Negative.   Skin: Negative.   Neurological: Negative.   Endo/Heme/Allergies: Negative.   Psychiatric/Behavioral: Negative.      Objective:   Vitals:   04/18/18 0837 04/18/18 0857  BP: (!) 138/92 (!) 138/102  Pulse: 76   Resp: 16           Physical Exam  HENT:  Head: Normocephalic.  Right Ear: Tympanic membrane, external ear and ear canal normal.  Left Ear: Tympanic membrane, external ear and ear canal normal.  Nose: Nose normal. No mucosal edema or rhinorrhea.  Mouth/Throat: Uvula is midline, oropharynx is clear and moist and mucous membranes are normal. No oropharyngeal exudate.  Eyes: Conjunctivae are normal.  Neck: Trachea normal. No tracheal tenderness present. No tracheal deviation present. No thyromegaly present.  Cardiovascular: Normal rate, regular rhythm, S1  normal, S2 normal and normal heart sounds.  No murmur heard. Pulmonary/Chest: Breath sounds normal. No stridor. No respiratory distress. She has no wheezes. She has no rales.  Musculoskeletal: She exhibits no edema.  Lymphadenopathy:       Head (right side): No tonsillar adenopathy present.       Head (left side): No tonsillar adenopathy present.    She has no cervical adenopathy.  Neurological: She is alert.  Skin: No rash noted. She is not diaphoretic. No erythema. Nails show no clubbing.    Diagnostics:    Results of the sinus CT scan obtained on 23 November 2017 identified no  significant sinusitis, nasal cavity swelling, slight left deviated septum, and right concha bullosa  Assessment and Plan:   1. Other allergic rhinitis   2. Migraine syndrome     1.  Continue to perform Allergen avoidance measures  2.  Continue to Treat and prevent inflammation:   A.  Montelukast 10 mg tablet 1 time per day  B.  Flonase 1 spray each nostril 1 time per day  C.  Claritin 10mg  tablet 1 time per day  D.  Prednisone 10mg  1 tablet one time per day for 10 days only  3. Continue to eliminate caffeine and chocolate consumption  4.  If needed:   A.  Nasal saline spray  5.  Return in 6 months or earlier if problem  6. Obtain fall flu vaccine  Sydney Crosby appears to be having symptoms secondary to significant inflammation of her airway.  The trigger for this issue is not entirely clear.  This may have been exposure to mold while she was down in [place] or it may have been secondary to a viral respiratory tract infection.  At this point I am not going to give her any antibiotics but I will give her a low dose of systemic steroids to help with her inflammation while she continues to utilize montelukast and Flonase on a pretty consistent basis.  I will see her back in this clinic in 6 months or earlier if there is a problem.  Sydney Schimke, MD Allergy / Immunology [facility]

## 2018-04-19 NOTE — Telephone Encounter (Addendum)
I called [facility] to check status of the patients medication. I spoke with Freida BusmanAllen who stated that it was still pending.

## 2018-04-22 ENCOUNTER — Encounter: Payer: Self-pay | Admitting: Allergy and Immunology

## 2018-04-22 NOTE — Telephone Encounter (Signed)
I called [facility] to check status of the patients medication. This script was started in June and still has not been completed. I spoke with Marcelle Smilingatasha in the escalation department who said that the "only thing they can do is escalate it more". She told me it would be taken care of today and to call back in 4 hours.

## 2018-04-24 ENCOUNTER — Telehealth: Payer: Self-pay | Admitting: *Deleted

## 2018-04-24 ENCOUNTER — Encounter

## 2018-04-24 ENCOUNTER — Encounter: Payer: Self-pay | Admitting: Neurology

## 2018-04-24 ENCOUNTER — Ambulatory Visit: Payer: Medicaid Other | Admitting: Neurology

## 2018-04-24 VITALS — BP 129/80 | HR 71 | Ht 61.0 in | Wt 136.5 lb

## 2018-04-24 DIAGNOSIS — G43709 Chronic migraine without aura, not intractable, without status migrainosus: Secondary | ICD-10-CM | POA: Diagnosis not present

## 2018-04-24 DIAGNOSIS — IMO0002 Reserved for concepts with insufficient information to code with codable children: Secondary | ICD-10-CM

## 2018-04-24 MED ORDER — ELETRIPTAN HYDROBROMIDE 40 MG PO TABS: 40.0000 mg | ORAL_TABLET | ORAL | 6 refills | Status: DC | PRN

## 2018-04-24 NOTE — Telephone Encounter (Signed)
PA for eletriptan initiated with [place] [employer] via phone.  Pt ZO#[id number].  Case ID# [id number].  Decision pending.

## 2018-04-24 NOTE — Progress Notes (Signed)
**  Botox 100 units x 2 vials, NDC 0023-1145-01, Lot [id number], Exp 10/2020, specialty pharmacy.//mck,rn** 

## 2018-04-24 NOTE — Progress Notes (Addendum)
PATIENT: Sydney Crosby DOB: 10-Jun-1986  Chief Complaint  Patient presents with  . Migraine    Botox 100 units x 2 vials - specialty pharmacy (1st injection)     HISTORICAL  Babette Relicammy Cecelia ByarsBarham is a 32 year old female, seen in refer by her primary care PA O'Buch, Edgardo RoysGreta, for evaluation of frequent migraine headache, initial evaluation was on December 03, 2017.  She reported history of migraine headaches since she was 32 years old, only happens occasionally, began to have gradual worsening migraine headaches since age 32, this was following her contraceptive treatment, especially since her recent childbirth in 2015, she complains of worsening headaches, every other days, using frequent over-the-counter herb oil, sometimes Tylenol, Excedrin migraines with limited help,  Previously she was given a prescription of Topamax, which has helped her headache, but she complains of intolerable side effect of paresthesia, mental slowing, propanolol, upset stomach, nortriptyline, unsure benefit,  She has tried Imitrex, cause blurry vision.   Her typical migraine is left side severe pounding headache with associated light noise sensitivity, nauseous, lasting for hours, 2 days, Aleve as needed seems to help her headaches.  Laboratory evaluations on November 2019, normal CBC hemoglobin of 12.4, CMP, creatinine of 0.75, LDL of 164, normal TSH  UPDATE March 07 2018: She has tried Effexor for 2 months, without significant improvement of her headaches, she stays home for her children at age 89, 89, is very frustrated about her frequent headaches at least twice a week, Zofran was helpful, but Maxalt was not helpful,  Her aunt has good response to Botox injection as migraine prevention, that is what she wants to,  She has frequent migraine headaches, lateralized severe pounding headaches light noise sensitive, nauseous, lasting for more than 4 hours, sometimes 2 days,    UPDATE April 24 2018: This is her first BOTOX  injection, potential side effect explained.  REVIEW OF SYSTEMS: Full 14 system review of systems performed and notable only for dizziness, headaches, sleepiness, anxiety, constipation, skin sensitivity, fatigue  ALLERGIES: No Known Allergies  HOME MEDICATIONS: Current Outpatient Medications  Medication Sig Dispense Refill  . botulinum toxin Type A (BOTOX) 100 units SOLR injection Inject 200 Units into the muscle every 3 (three) months.    . diphenhydrAMINE (BENADRYL) 25 mg capsule Take 25 mg by mouth every 6 (six) hours as needed for allergies.    . fluticasone (FLONASE) 50 MCG/ACT nasal spray Place 1 spray into both nostrils daily. 16 g 5  . loratadine (CLARITIN) 10 MG tablet Take 1 tablet (10 mg total) by mouth daily. 30 tablet 5  . montelukast (SINGULAIR) 10 MG tablet Take 1 tablet (10 mg total) by mouth at bedtime. 30 tablet 5  . Multiple Vitamins-Minerals (MULTIVITAMIN PO) Take by mouth daily.    . ondansetron (ZOFRAN ODT) 4 MG disintegrating tablet Take 1 tablet (4 mg total) by mouth every 8 (eight) hours as needed. 20 tablet 11  . SUMAtriptan (IMITREX) 6 MG/0.5ML SOLN injection Inject 6mg  at onset of migraine.  May repeat in 2 hrs, if needed.  Max dose: 2 injections/day. This is a 30 day prescription. 12 vial 11  . topiramate (TOPAMAX) 100 MG tablet Take 1 tablet (100 mg total) by mouth 2 (two) times daily. 60 tablet 11   No current facility-administered medications for this visit.     PAST MEDICAL HISTORY: Past Medical History:  Diagnosis Date  . Allergic rhinitis   . Migraines     PAST SURGICAL HISTORY: Past Surgical History:  Procedure  Laterality Date  . CESAREAN SECTION  2015  . TONSILLECTOMY      FAMILY HISTORY: Family History  Problem Relation Age of Onset  . Migraines Mother   . Kidney disease Mother   . Stroke Mother   . Hypercholesterolemia Mother   . Hypertension Mother   . Lung disease Father   . Seizures Father   . Allergic rhinitis Maternal  Grandmother   . Heart attack Maternal Aunt   . Migraines Maternal Aunt   . Migraines Maternal Aunt   . Migraines Maternal Uncle   . Alzheimer's disease Paternal Grandmother     SOCIAL HISTORY:  Social History   Socioeconomic History  . Marital status: Legally Separated    Spouse name: Not on file  . Number of children: 2  . Years of education: some college  . Highest education level: Not on file  Occupational History  . Occupation: Futures trader  Social Needs  . Financial resource strain: Not on file  . Food insecurity:    Worry: Not on file    Inability: Not on file  . Transportation needs:    Medical: Not on file    Non-medical: Not on file  Tobacco Use  . Smoking status: Never Smoker  . Smokeless tobacco: Never Used  Substance and Sexual Activity  . Alcohol use: No  . Drug use: No  . Sexual activity: Yes    Birth control/protection: None  Lifestyle  . Physical activity:    Days per week: Not on file    Minutes per session: Not on file  . Stress: Not on file  Relationships  . Social connections:    Talks on phone: Not on file    Gets together: Not on file    Attends religious service: Not on file    Active member of club or organization: Not on file    Attends meetings of clubs or organizations: Not on file    Relationship status: Not on file  . Intimate partner violence:    Fear of current or ex partner: Not on file    Emotionally abused: Not on file    Physically abused: Not on file    Forced sexual activity: Not on file  Other Topics Concern  . Not on file  Social History Narrative   Lives at home with mother, daughter and son.   Right-handed.   Occasional soda.     PHYSICAL EXAM   Vitals:   04/24/18 1306  BP: 129/80  Pulse: 71  Weight: 136 lb 8 oz (61.9 kg)  Height: 5\' 1"  (1.549 m)    Not recorded      Body mass index is 25.79 kg/m.  PHYSICAL EXAMNIATION:  Gen: NAD, conversant, well nourised, obese, well groomed                       Cardiovascular: Regular rate rhythm, no peripheral edema, warm, nontender. Eyes: Conjunctivae clear without exudates or hemorrhage Neck: Supple, no carotid bruits. Pulmonary: Clear to auscultation bilaterally   NEUROLOGICAL EXAM:  MENTAL STATUS: Speech:    Speech is normal; fluent and spontaneous with normal comprehension.  Cognition:     Orientation to time, place and person     Normal recent and remote memory     Normal Attention span and concentration     Normal Language, naming, repeating,spontaneous speech     Fund of knowledge   CRANIAL NERVES: CN II: Visual fields are full to confrontation. Fundoscopic exam  is normal with sharp discs and no vascular changes. Pupils are round equal and briskly reactive to light. CN III, IV, VI: extraocular movement are normal. No ptosis. CN V: Facial sensation is intact to pinprick in all 3 divisions bilaterally. Corneal responses are intact.  CN VII: Face is symmetric with normal eye closure and smile. CN VIII: Hearing is normal to rubbing fingers CN IX, X: Palate elevates symmetrically. Phonation is normal. CN XI: Head turning and shoulder shrug are intact CN XII: Tongue is midline with normal movements and no atrophy.  MOTOR: There is no pronator drift of out-stretched arms. Muscle bulk and tone are normal. Muscle strength is normal.  REFLEXES: Reflexes are 2+ and symmetric at the biceps, triceps, knees, and ankles. Plantar responses are flexor.  SENSORY: Intact to light touch, pinprick, positional sensation and vibratory sensation are intact in fingers and toes.  COORDINATION: Rapid alternating movements and fine finger movements are intact. There is no dysmetria on finger-to-nose and heel-knee-shin.    GAIT/STANCE: Posture is normal. Gait is steady with normal steps, base, arm swing, and turning. Heel and toe walking are normal. Tandem gait is normal.  Romberg is absent.   DIAGNOSTIC DATA (LABS, IMAGING, TESTING) - I reviewed  patient records, labs, notes, testing and imaging myself where available.   ASSESSMENT AND PLAN  Tearah Kemp is a 32 y.o. female   Chronic migraine headaches  Try and failed preventive medications Effexor 75 mg  Botox injection preauthorization as migraine prevention    BOTOX injection was performed according to protocol by [facility]. 100 units of BOTOX was dissolved into 2 cc NS.             Patient tolerate the injection well. Will return for repeat injection in 3 months.  Extra 45 units were injected along bilateral parietal temporal regions.  She complains of lightheadedness, wooziness after the injection, improved after lying down for a while     Levert Feinstein, M.D. Ph.D.  [facility] [address], [address] [place], [place] [address] Ph: [phone] Fax: [phone]  CC: Eunice Blase, [place]

## 2018-04-25 ENCOUNTER — Telehealth: Payer: Self-pay | Admitting: Neurology

## 2018-04-25 NOTE — Telephone Encounter (Signed)
She called 04/24/2018 pm from [facility].    She had had Botox earlier that day and felt strange with more pain and nausea.   We discussed that there can be more pain after Botox sometimes but it should get better.  I also spoke to her MD in the ED and recommended Zofran for her nausea.

## 2018-04-26 ENCOUNTER — Encounter: Payer: Self-pay | Admitting: *Deleted

## 2018-04-26 NOTE — Telephone Encounter (Signed)
I have spoken with the patient who reports the following: pain in back of head, blurred vision and nausea.  She has mild soreness at the injection sites.  Dr. Terrace ArabiaYan has reviewed her chart.  These symptoms appear to be an active migraine.  The patient has not used her ondansetron or eletriptan yet.  She has only tried one tablet of Excedrin with mild relief.  Fees her anxiety is playing a role in her symptoms.  She has been recently evaluated by her PCP to discuss her anxiety disorder.  He recommended she start Wellbutrin but she has not tried the medication yet.  The patient also spoke to Dr. Epimenio FootSater last evening who also informed her there can be more pain after Botox but that the symptoms should improve.  She was instructed to take her home rescue medications and lay down to rest.  She was agreeable to this plan.  I also [place] her mother ([place] and left a message just letting her know we have spoken to her daughter.

## 2018-04-26 NOTE — Telephone Encounter (Signed)
Pt's mother called she spoke with the pt: her vision is blurry, said she would rather die than feel like this, she said thoughts are coming in her mind that she would not normally think, she is slow at comprehending. Pt's mother said she is at home alone and she is very concerned. Pt told her mother that she would not be contacted until Monday. I made her aware that I told the pt when she called on earlier that RN would call her as Dr Terrace ArabiaYan had already sent a msg for Rn to check on her today. The mother was appreciative but is wanting Rn to check on pt asap.

## 2018-04-26 NOTE — Telephone Encounter (Signed)
PA approved through 04/19/2019.

## 2018-04-26 NOTE — Telephone Encounter (Signed)
Pt called back today. She is foggy and has a lot of pressure on the back of her neck. Please call to advise. She is aware message has been sent from Dr Terrace ArabiaYan to RN to check on her today.

## 2018-04-26 NOTE — Telephone Encounter (Signed)
Please check on patient today. She might have a migraine headache, rather than side effect from BOTOX injection.

## 2018-05-06 NOTE — Telephone Encounter (Addendum)
Spoke to patient - she is still having some injection site soreness/decreased sensation under her scalp but no weakness. She will use OTC NSAIDS sparingly and moist heat to the area (being careful not to burn her skin).  She is still having some migraines with dizziness and blurred vision.  She is aware that it is still early for Botox to be fully effective.  She has been using her rizatriptan with some benefit.

## 2018-05-06 NOTE — Telephone Encounter (Signed)
Pt requesting a call, stating he neck is still in pain from the botox(or what she things is a s/e from botox) stating she is still having dizzy spells. Requesting a call to discuss if there is anything to help with the pain. Please call to advise

## 2018-05-07 NOTE — Telephone Encounter (Addendum)
I have left a detailed message on her voicemail (ok per DPR) requesting her to call us back, the second week of September, to update us on how she is feeling since having her first Botox injection on 04/24/18.  If she would like to move forward with a second injection, we will keep her appt the same on 07/25/18.  If she would like to discuss treatment with one of the CGRP antagonists, then we will convert her appt on 07/25/18 to a regular office visit and notify Duwayne HeckDanielle so that her Botox is not ordered.  I also provided our number to call back with any questions.

## 2018-05-07 NOTE — Telephone Encounter (Signed)
Spoke to patient and reviewed the message below.  She verbalized understanding and will call back to let us know how she would like to proceed.

## 2018-05-07 NOTE — Telephone Encounter (Signed)
Will revaluate patient first before next injection on Jul 25 2018. Advise her to call back by Sept 7, 2019 to see if she still want to continue botox vs CGRP antagonist

## 2018-05-07 NOTE — Telephone Encounter (Signed)
Patient returning a call. I advised her of previous message and she would like a call back to discuss.

## 2018-05-08 ENCOUNTER — Encounter: Payer: Self-pay | Admitting: Allergy and Immunology

## 2018-05-08 ENCOUNTER — Ambulatory Visit (INDEPENDENT_AMBULATORY_CARE_PROVIDER_SITE_OTHER): Payer: Medicaid Other | Admitting: Allergy and Immunology

## 2018-05-08 VITALS — BP 138/84 | HR 88 | Temp 98.2°F | Resp 18

## 2018-05-08 DIAGNOSIS — R42 Dizziness and giddiness: Secondary | ICD-10-CM | POA: Diagnosis not present

## 2018-05-08 DIAGNOSIS — H6981 Other specified disorders of Eustachian tube, right ear: Secondary | ICD-10-CM

## 2018-05-08 DIAGNOSIS — G43909 Migraine, unspecified, not intractable, without status migrainosus: Secondary | ICD-10-CM | POA: Diagnosis not present

## 2018-05-08 DIAGNOSIS — J3089 Other allergic rhinitis: Secondary | ICD-10-CM

## 2018-05-08 MED ORDER — MECLIZINE HCL 12.5 MG PO TABS
ORAL_TABLET | ORAL | 2 refills | Status: AC
Start: 2018-05-08 — End: ?

## 2018-05-08 MED ORDER — METHYLPREDNISOLONE ACETATE 80 MG/ML IJ SUSP
40.0000 mg | Freq: Once | INTRAMUSCULAR | Status: AC
  Administered 2018-05-08: 40 mg via INTRAMUSCULAR

## 2018-05-08 NOTE — Progress Notes (Signed)
Follow-up Note  Referring Provider: Eunice Blase'Buch, Greta, PA-C Primary Provider: Eunice Blase'Buch, Greta, PA-C Date of Office Visit: 05/08/2018  Subjective:   Real Consammy Ouk (DOB: 12/26/1985) is a 32 y.o. female who [facility] in re-evaluation of the following:  HPI: Sakshi presents to this clinic in evaluation of allergic rhinitis and headaches.  Her last visit to this clinic was 18 April 2018.  Xoie returns from a neurology appointment on 24 April 2018 at which point in time she received Botox injections in her scalp for her persistent migraine headaches.  Apparently when she had an injection in her right temporal area she immediately felt a shooting pain run down into her right jaw.  Since that point in time she has had a continued pain in that area along with a fullness in her right ear and has developed some systemic symptoms in the form of feeling as though she has the flu with body aches and most recently has developed vertigo with the room moving and feeling as though she is moving and very unsteady.  After returning home from receiving her Botox injections she apparently felt terrible and had a panic attack with shortness of breath and tingling of her hands with the sensation of her arms locking up for which she went to the emergency room.  On 26 April 2018 she was treated with amitriptyline and every time she takes amitriptyline she just feels awful and it appears to precipitate body aches.  Her last dose of amitriptyline was 48 hours ago.  It should be noted that Jacquie has been having a problem on the right side of her face for a while.  Apparently while she was vacationing in [place] in June she developed nasal congestion and head pressure and she was treated with Kenalog and Augmentin by her primary care doctor but she always has remained with a lot of pressure on the right side of her face.  It should be noted that as well that with similar complaints she  did have a CT scan of her sinuses performed on 23 November 2017 which identified no sinusitis.  Allergies as of 05/08/2018   No Known Allergies     Medication List      diphenhydrAMINE 25 mg capsule Commonly known as:  BENADRYL Take 25 mg by mouth every 6 (six) hours as needed for allergies.   eletriptan 40 MG tablet Commonly known as:  RELPAX Take 1 tablet (40 mg total) by mouth as needed for migraine or headache. May repeat in 2 hours if headache persists or recurs.   fluticasone 50 MCG/ACT nasal spray Commonly known as:  FLONASE Place 1 spray into both nostrils daily.   loratadine 10 MG tablet Commonly known as:  CLARITIN Take 1 tablet (10 mg total) by mouth daily.   metroNIDAZOLE 0.75 % vaginal gel Commonly known as:  METROGEL Place vaginally.   montelukast 10 MG tablet Commonly known as:  SINGULAIR Take 1 tablet (10 mg total) by mouth at bedtime.   MULTIVITAMIN PO Take by mouth daily.   ondansetron 4 MG disintegrating tablet Commonly known as:  ZOFRAN-ODT Take 1 tablet (4 mg total) by mouth every 8 (eight) hours as needed.   topiramate 100 MG tablet Commonly known as:  TOPAMAX Take 1 tablet (100 mg total) by mouth 2 (two) times daily.       Past Medical History:  Diagnosis Date  . Allergic rhinitis   . Migraines  Past Surgical History:  Procedure Laterality Date  . CESAREAN SECTION  2015  . TONSILLECTOMY      Review of systems negative except as noted in HPI / PMHx or noted below:  Review of Systems  Constitutional: Negative.   HENT: Negative.   Eyes: Negative.   Respiratory: Negative.   Cardiovascular: Negative.   Gastrointestinal: Negative.   Genitourinary: Negative.   Musculoskeletal: Negative.   Skin: Negative.   Neurological: Negative.   Endo/Heme/Allergies: Negative.   Psychiatric/Behavioral: Negative.      Objective:   Vitals:   05/08/18 1517  BP: 138/84  Pulse: 88  Resp: 18  Temp: 98.2 F (36.8 C)          Physical  Exam  HENT:  Head: Normocephalic.  Right Ear: External ear and ear canal normal. Tympanic membrane is retracted (Lack of pneumatic movement).  Left Ear: Tympanic membrane, external ear and ear canal normal.  Nose: Nose normal. No mucosal edema or rhinorrhea.  Mouth/Throat: Uvula is midline, oropharynx is clear and moist and mucous membranes are normal. No oropharyngeal exudate.  Eyes: Conjunctivae are normal.  Neck: Trachea normal. No tracheal tenderness present. No tracheal deviation present. No thyromegaly present.  Cardiovascular: Normal rate, regular rhythm, S1 normal, S2 normal and normal heart sounds.  No murmur heard. Pulmonary/Chest: Breath sounds normal. No stridor. No respiratory distress. She has no wheezes. She has no rales.  Musculoskeletal: She exhibits no edema.  Lymphadenopathy:       Head (right side): No tonsillar adenopathy present.       Head (left side): No tonsillar adenopathy present.    She has no cervical adenopathy.  Neurological: She is alert.  Skin: No rash noted. She is not diaphoretic. No erythema. Nails show no clubbing.    Diagnostics: none  Assessment and Plan:   1. Migraine syndrome   2. Perennial allergic rhinitis   3. Vertigo   4. ETD (Eustachian tube dysfunction), right     1.  Eliminate use of amitriptyline  2.  Continue to Treat and prevent inflammation:   A.  Montelukast 10 mg tablet 1 time per day  B.  Flonase 1 spray each nostril 1 time per day  C.  Claritin 10mg  tablet 1 time per day  D.  Depomedrol 40mg  IM delivered in clinic today   3. Continue to eliminate caffeine and chocolate consumption  4.  Treat vertigo:   A.  Meclizine 12.5 mg twice a day.  Can increase to 4 times per day if needed  5.  Evaluation with ENT for right ETD  6.  Return in 4 weeks or earlier if problem  It is not entirely clear what is going on with Cody but I wonder if she does not have a component of trigeminal neuralgia or some facial pain syndrome  that may have been precipitated by an injection with Botox earlier this month.  As well, she appeared to have right-sided face problems even prior to this issue.  I will treat her with systemic steroids and anti-inflammatory agents for her airway and we will get her right ETD evaluated by an ENT and I have given her some meclizine to use for her vertigo which may also help her headache to some degree.  It does sound as though she cannot tolerate the amitriptyline and she will stop the use of this agent at this point.  Further evaluation and treatment will be based upon her response to this approach.  Laurette SchimkeEric Kozlow, MD Allergy /  Immunology [facility]

## 2018-05-08 NOTE — Patient Instructions (Addendum)
  1.  Eliminate use of amitriptyline  2.  Continue to Treat and prevent inflammation:   A.  Montelukast 10 mg tablet 1 time per day  B.  Flonase 1 spray each nostril 1 time per day  C.  Claritin 10mg  tablet 1 time per day  D. Depomedrol 40mg  IM delivered in clinic today   3. Continue to eliminate caffeine and chocolate consumption  4.  Treat vertigo:   A.  Meclizine 12.5 mg twice a day.  Can increase to 4 times per day if needed  5.  Evaluation with ENT for right ETD  6.  Return in 4 weeks or earlier if problem

## 2018-05-09 ENCOUNTER — Encounter: Payer: Self-pay | Admitting: Allergy and Immunology

## 2018-05-10 NOTE — Telephone Encounter (Signed)
Pt states she is unable to tolerate eletriptan (RELPAX) 40 MG tablet, anything in the "tan family" they cause her to feel dizzy, muscle aches, pain in arms, sick feeling.  Pt states she has an aunt who takes Lobbyisterocet and works Adult nursewonderfully for her.  Pt would like to know if she could be considered for it.  Please call

## 2018-05-10 NOTE — Addendum Note (Signed)
Addended by: Lindell SparKIRKMAN, Deicy Rusk C on: 05/10/2018 11:42 AM   Modules accepted: Orders

## 2018-05-13 ENCOUNTER — Telehealth: Payer: Self-pay | Admitting: Allergy and Immunology

## 2018-05-13 NOTE — Telephone Encounter (Signed)
Sydney Crosby called in and stated she still feels dizzy and that she feels like she  Is "under water and feels spacy."  Sydney Crosby wanted to know if Dr. Kirtland BouchardK referred her to ENT bc he thinks that's where the dizziness is coming from, or does he think it is from Botox?  Sydney Crosby also wanted to let Dr. Kirtland BouchardK know the medicine that was prescribed for the dizziness makes her very sleepy.  Please advise.

## 2018-05-13 NOTE — Telephone Encounter (Signed)
Patient called on call for fioricet Rx, called in 10 tabs for each 30 days, with 3 refills.

## 2018-05-13 NOTE — Telephone Encounter (Signed)
Please ask Kennis to decrease her meclizine in half (break tablet) and see if she can tolerate that lower dose. Refer to ENT for vertigo.

## 2018-05-13 NOTE — Telephone Encounter (Signed)
Advised patient of instructions.  Already faxed request to PCP for referral to ENT

## 2018-05-30 ENCOUNTER — Telehealth: Payer: Self-pay | Admitting: Neurology

## 2018-05-30 NOTE — Telephone Encounter (Signed)
Left message requesting a return call.

## 2018-05-30 NOTE — Telephone Encounter (Signed)
Pt Dr Apolinar JunesBrandon Ma/ENT in [place]. He thinks she has a certain type of migraine- vestibular migraines. She said prior to botox she never had dizzy spells. She is not wanting to have botox anymore. She has a lot of questions and would like to come in and talk with Dr Terrace ArabiaYan. Please call to advise of an appt date.

## 2018-05-30 NOTE — Telephone Encounter (Signed)
Noted, thank you

## 2018-05-30 NOTE — Telephone Encounter (Signed)
Spoke to patient - she does not wish to continue Botox (Dr. Terrace ArabiaYan aware).  She would like to discuss other treatment options.  Her pending appt has been converted to an office visit.

## 2018-06-06 ENCOUNTER — Ambulatory Visit (INDEPENDENT_AMBULATORY_CARE_PROVIDER_SITE_OTHER): Payer: Medicaid Other | Admitting: Allergy and Immunology

## 2018-06-06 VITALS — BP 120/86 | HR 88 | Resp 18

## 2018-06-06 DIAGNOSIS — J3089 Other allergic rhinitis: Secondary | ICD-10-CM

## 2018-06-06 DIAGNOSIS — G43909 Migraine, unspecified, not intractable, without status migrainosus: Secondary | ICD-10-CM

## 2018-06-06 DIAGNOSIS — R42 Dizziness and giddiness: Secondary | ICD-10-CM

## 2018-06-06 DIAGNOSIS — H9313 Tinnitus, bilateral: Secondary | ICD-10-CM | POA: Diagnosis not present

## 2018-06-06 DIAGNOSIS — G4720 Circadian rhythm sleep disorder, unspecified type: Secondary | ICD-10-CM

## 2018-06-06 MED ORDER — CYPROHEPTADINE HCL 4 MG PO TABS: ORAL_TABLET | ORAL | 5 refills | Status: DC

## 2018-06-06 MED ORDER — AZELASTINE HCL 137 MCG/SPRAY NA SOLN: 1.0000 | Freq: Two times a day (BID) | NASAL | 5 refills | Status: DC

## 2018-06-06 NOTE — Progress Notes (Signed)
Follow-up Note  Referring Provider: Eunice Blase, PA-C Primary Provider: Eunice Blase, PA-C Date of Office Visit: 06/06/2018  Subjective:   Sydney Crosby (DOB: [birth year]) is a 32 y.o. female who [facility] in re-evaluation of the following:  HPI: Sydney Crosby returns to this clinic in reevaluation of her allergic rhinitis and headaches and tinnitus.  I last saw her in this clinic on 08 May 2018 at which point in time we try to address these issues.  She still continues to have some nasal stuffiness and fullness in her face and this gets much worse whenever she has a headache.  She still continues to have persistent ringing in both ears and apparently evaluation with ENT did not identify any significant ear disorder but did identify a deviated septum for which it was recommended that she undergo repair.  She has contacted her neurologist and she has an appointment on July 25, 2018 to readdress her almost daily chronic headache.  She is dizzy and has ringing in her years bilaterally and she did try the meclizine I prescribed and she is only able to tolerate 6.25 mg twice a day which definitely does help her dizziness.  However, she still very hesitant to drive a car because she does feel little bit unsteady.  She has discontinued her amitriptyline as this did not really help her and gave her some side effects.  She continues to use Topamax.  She thinks that she still suffering some side effects from the Botox injections that she received in August.  She notes a very close temporal relationship between the onset of her tinnitus and her nasal stuffiness and the administration of her Botox.  When she takes a triptan to treat a headache it certainly helps her headache but unfortunately she gets the sensation of chest fullness and shortness of breath and gets body aches when using these medications.  She has very disrupted sleep.  She is up and down  all night.  She has woken up at nighttime with some gasping.  She has never had a sleep study.  She has been taking magnesium at nighttime to help sleep.  Allergies as of 06/06/2018   No Known Allergies     Medication List      B-COMPLEX PO Take by mouth.   buPROPion 75 MG tablet Commonly known as:  WELLBUTRIN Take 75 mg by mouth 2 (two) times daily.   butalbital-acetaminophen-caffeine 50-325-40 MG tablet Commonly known as:  FIORICET, ESGIC TAKE 1 TABLET BY MOUTH AS NEEDED FOR MIGRAINE (ONE MONTH SUPPLY)   diphenhydrAMINE 25 mg capsule Commonly known as:  BENADRYL Take 25 mg by mouth every 6 (six) hours as needed for allergies.   fluticasone 50 MCG/ACT nasal spray Commonly known as:  FLONASE Place 1 spray into both nostrils daily.   loratadine 10 MG tablet Commonly known as:  CLARITIN Take 1 tablet (10 mg total) by mouth daily.   MAGNESIUM PO Take by mouth.   meclizine 12.5 MG tablet Commonly known as:  ANTIVERT Take one tablet twice daily. Can increase to 4 times daily if needed.   montelukast 10 MG tablet Commonly known as:  SINGULAIR Take 1 tablet (10 mg total) by mouth at bedtime.   MULTIVITAMIN PO Take by mouth daily.   ondansetron 4 MG disintegrating tablet Commonly known as:  ZOFRAN-ODT Take 1 tablet (4 mg total) by mouth every 8 (eight) hours as needed.   topiramate 100 MG tablet  Commonly known as:  TOPAMAX Take 1 tablet (100 mg total) by mouth 2 (two) times daily.       Past Medical History:  Diagnosis Date  . Allergic rhinitis   . Migraines     Past Surgical History:  Procedure Laterality Date  . CESAREAN SECTION  2015  . TONSILLECTOMY      Review of systems negative except as noted in HPI / PMHx or noted below:  Review of Systems  Constitutional: Negative.   HENT: Negative.   Eyes: Negative.   Respiratory: Negative.   Cardiovascular: Negative.   Gastrointestinal: Negative.   Genitourinary: Negative.   Musculoskeletal: Negative.    Skin: Negative.   Neurological: Negative.   Endo/Heme/Allergies: Negative.   Psychiatric/Behavioral: Negative.      Objective:   Vitals:   06/06/18 1138  BP: 120/86  Pulse: 88  Resp: 18          Physical Exam  HENT:  Head: Normocephalic.  Right Ear: Tympanic membrane, external ear and ear canal normal.  Left Ear: Tympanic membrane, external ear and ear canal normal.  Nose: Nose normal. No mucosal edema or rhinorrhea.  Mouth/Throat: Uvula is midline, oropharynx is clear and moist and mucous membranes are normal. No oropharyngeal exudate.  Eyes: Conjunctivae are normal.  Neck: Trachea normal. No tracheal tenderness present. No tracheal deviation present. No thyromegaly present.  Cardiovascular: Normal rate, regular rhythm, S1 normal, S2 normal and normal heart sounds.  No murmur heard. Pulmonary/Chest: Breath sounds normal. No stridor. No respiratory distress. She has no wheezes. She has no rales.  Musculoskeletal: She exhibits no edema.  Lymphadenopathy:       Head (right side): No tonsillar adenopathy present.       Head (left side): No tonsillar adenopathy present.    She has no cervical adenopathy.  Neurological: She is alert.  Skin: No rash noted. She is not diaphoretic. No erythema. Nails show no clubbing.    Diagnostics: none  Assessment and Plan:   1. Perennial allergic rhinitis   2. Migraine syndrome   3. Vertigo   4. Tinnitus of both ears   5. Sleep stage or arousal from sleep dysfunction     1.  Start periactin 4mg  at bedtime  2.  Continue to Treat and prevent inflammation:   A.  Montelukast 10 mg tablet 1 time per day  B.  Flonase 1 spray each nostril 2 time per day  C.  Azelastine 1 spray each nostril 2 times per day   3. Continue to eliminate caffeine and chocolate consumption  4.  If needed: Meclizine 12.5 mg 1/2 tablet twice a day.    5. Maybe obtain a [facility] at [facility]   6. Return in 4 weeks or earlier if  problem  Sydney Crosby will use a combination of a leukotriene modifier, nasal steroid, and nasal antihistamine to help with her upper airway stuffiness.  She can continue on meclizine as needed to help with her vertigo.  I started her on Periactin to address her rather significant sleep dysfunction and hopefully to act as a preventative for migraines along with her Topamax.  She may benefit from obtaining a sleep study should she still have disturbed sleep in the face of this treatment.  I will regroup with her in 4 weeks to assess her response to this approach.  Laurette SchimkeEric Kozlow, MD Allergy / Immunology [facility]

## 2018-06-06 NOTE — Patient Instructions (Addendum)
  1.  Start periactin 4mg  at bedtime  2.  Continue to Treat and prevent inflammation:   A.  Montelukast 10 mg tablet 1 time per day  B.  Flonase 1 spray each nostril 2 time per day  C.  Azelastine 1 spray each nostril 2 times per day   3. Continue to eliminate caffeine and chocolate consumption  4.  If needed: Meclizine 12.5 mg 1/2 tablet twice a day.    5. Maybe obtain a sleep study at [facility]   6. Return in 4 weeks or earlier if problem

## 2018-06-10 ENCOUNTER — Encounter: Payer: Self-pay | Admitting: Allergy and Immunology

## 2018-06-13 ENCOUNTER — Telehealth: Payer: Self-pay | Admitting: Allergy and Immunology

## 2018-06-13 NOTE — Telephone Encounter (Signed)
[email or web address] has called [id number] she states pt called them stating the Botox did not work for her and Elly Modena states as a result of that they need the LOT# Please return call to [phone] option 3

## 2018-06-13 NOTE — Telephone Encounter (Signed)
Sydney Crosby called in and stated that she went to the ENT per Dr. Kathyrn Lass referral.  Sydney Crosby states they want to perform surgery for a Deviated Septum.  Sydney Crosby would like to know if Dr. Lucie Leather feels she should get this surgery.  Please advise.

## 2018-06-17 NOTE — Telephone Encounter (Signed)
Discussed the issue of ENT surgery and also discussed the issue of "impacted wisdom teeth" which apparently she was diagnosed by her dentist and also discussed the suggestion that was made to her by someone about visiting with a chiropractor to help with some of her chronic headache and neck pain.

## 2018-06-20 ENCOUNTER — Telehealth: Payer: Self-pay

## 2018-06-20 ENCOUNTER — Other Ambulatory Visit: Payer: Self-pay

## 2018-06-20 MED ORDER — ERENUMAB-AOOE 70 MG/ML ~~LOC~~ SOAJ: 1.0000 | SUBCUTANEOUS | 5 refills | Status: DC

## 2018-06-20 NOTE — Telephone Encounter (Signed)
Called patient and let her know that the Ajovy is not preferred by her insurance, but we can try another injectable called Aimovig, per Dr. Lucie Leather.  I sent RX to [facility] and will see if insurance will cover it.

## 2018-06-20 NOTE — Telephone Encounter (Signed)
Called patient, per Dr. Kathyrn Lass request, and told her about a monthly injectable for migraine prevention called AJOVY.  She mentioned she had wanted to start that in the past but neurologist wanted to try Botox first.  Patient is interested in starting and I told her we will go ahead and start prior approval process. Patient did mention that she is still having trouble, has had a migraine since last Wednesday.  Also having dizziness, foggy-headedness, ringing in ears.  The triptans help but cause her heart to pound, blood pressure to rise, feeling full headed.  Another migraine medication that she takes, with caffeine in it,  that does help.  She has started to go to chiropractor and did see dentist who mentioned TMJ.  Is there anything else you can recommend to do or take while we wait for the new injectable? Please advise

## 2018-06-24 NOTE — Telephone Encounter (Signed)
Needs biological agent to try and control migraines. Has failed multiple preventatives including amitriptyline and Topamax and Botox.  We can get her started on a biological agent and she can have the results of that biological agent available for her neurology appointment in November.

## 2018-06-24 NOTE — Telephone Encounter (Signed)
Called in Aimovig injection to [facility] last Thursday.  Will call patient tomorrow and ask her to bring it to office so we can show her how to administer the injection.  Patient has picked up the injection from pharmacy, per [facility].

## 2018-06-25 ENCOUNTER — Telehealth: Payer: Self-pay | Admitting: Allergy and Immunology

## 2018-06-25 NOTE — Telephone Encounter (Signed)
Patient will call office Wednesday and let us know when she can come in to start on the Aimovig.

## 2018-06-25 NOTE — Telephone Encounter (Signed)
Dalesha called in and states she would like Dr. Kathyrn Lass input on her anxiety and anxiety medications because she feels he is the only person to listen to her concerns.  Mayte states she has constant ringing in her ears and notices it only happens when she goes to the ENT office.  Jordie states Xanax was suggested but no one will prescribe it to her.  Cherrish states she is currently on Klonopin and Wellbutrin (75mg  2 times daily).  Liset states both make her feel funny and make her feel zoned out and on edge.  Doralene states noises tend to be amplified and she "feels crazy."  Dominique has not started Aimovig yet and states she will call the office some time tomorrow 10/9 to see when she can come in to have it administered. Please advise.

## 2018-06-25 NOTE — Telephone Encounter (Signed)
Please inform Yen that xanax and klonopin should probably not be used together in most cases. It is probably the klonopin that is making her feel 'zoned out'. She may want to try using 1/2 of the dose she is presently using.

## 2018-06-25 NOTE — Telephone Encounter (Signed)
Patient advised of instructions. 

## 2018-07-04 ENCOUNTER — Ambulatory Visit: Payer: Medicaid Other | Admitting: Allergy and Immunology

## 2018-07-25 ENCOUNTER — Telehealth: Payer: Self-pay | Admitting: Neurology

## 2018-07-25 ENCOUNTER — Ambulatory Visit: Payer: Medicaid Other | Admitting: Neurology

## 2018-07-25 ENCOUNTER — Encounter: Payer: Self-pay | Admitting: Neurology

## 2018-07-25 VITALS — BP 132/82 | HR 96 | Ht 61.0 in | Wt 137.5 lb

## 2018-07-25 DIAGNOSIS — R51 Headache: Secondary | ICD-10-CM | POA: Diagnosis not present

## 2018-07-25 DIAGNOSIS — G43709 Chronic migraine without aura, not intractable, without status migrainosus: Secondary | ICD-10-CM | POA: Diagnosis not present

## 2018-07-25 DIAGNOSIS — IMO0002 Reserved for concepts with insufficient information to code with codable children: Secondary | ICD-10-CM

## 2018-07-25 DIAGNOSIS — R519 Headache, unspecified: Secondary | ICD-10-CM

## 2018-07-25 MED ORDER — DICLOFENAC POTASSIUM(MIGRAINE) 50 MG PO PACK: 50.0000 mg | PACK | ORAL | 11 refills | Status: DC | PRN

## 2018-07-25 NOTE — Telephone Encounter (Signed)
Medicaid order sent to [facility]. They obtain the auth and will reach out to the pt to schedule.  °

## 2018-07-25 NOTE — Progress Notes (Signed)
PATIENT: Sydney Crosby DOB: 12/17/1985  Chief Complaint  Patient presents with  . Migraine    She is still taking Topamax 100mg  BID.  Her migraines have been more frequent.  She did not want to continue Botox injections.  Her PCP prescribed Aimovig 140mg  monthly but she has not started the medication yet.  Felt triptans made her feel worse.  Fioricet works well but does not take away all her pain.     HISTORICAL  Sydney Crosby is a 32 year old female, seen in refer by her primary care PA O'Buch, Edgardo Roys, for evaluation of frequent migraine headache, initial evaluation was on December 03, 2017.  She reported history of migraine headaches since she was 32 years old, only happens occasionally, began to have gradual worsening migraine headaches since age 40, this was following her contraceptive treatment, especially since her recent childbirth in 2015, she complains of worsening headaches, every other days, using frequent over-the-counter herb oil, sometimes Tylenol, Excedrin migraines with limited help,  Previously she was given a prescription of Topamax, which has helped her headache, but she complains of intolerable side effect of paresthesia, mental slowing, propanolol, upset stomach, nortriptyline, unsure benefit,  She has tried Imitrex, cause blurry vision.   Her typical migraine is left side severe pounding headache with associated light noise sensitivity, nauseous, lasting for hours, 2 days, Aleve as needed seems to help her headaches.  Laboratory evaluations on November 2019, normal CBC hemoglobin of 12.4, CMP, creatinine of 0.75, LDL of 164, normal TSH  UPDATE March 07 2018: She has tried Effexor for 2 months, without significant improvement of her headaches, she stays home for her children at age 80, 60, is very frustrated about her frequent headaches at least twice a week, Zofran was helpful, but Maxalt was not helpful,  Her aunt has good response to Botox injection as migraine  prevention, that is what she wants to,  She has frequent migraine headaches, lateralized severe pounding headaches light noise sensitive, nauseous, lasting for more than 4 hours, sometimes 2 days,    UPDATE April 24 2018: This is her first BOTOX injection, potential side effect explained.  UPDATE Jul 25 2018: Only in the first Botox injection on April 24, 2018 do not help her, seems to make her constellation of symptoms much worse, She only has two good days over past 3 months, now she has constant ringing in her ear, overwhelming, nauseous, was seen by ENT that was normal.  She did have anxiety, which is much worse now, was seen by her primary care physician wellbutrin, buspar, lexapro, clonazepam, did not help, previously also could not tolerate Effexor  If she gets a migraine, she has overwhelming anxiety, she took her aunt's xanax with ibuprofen 800 mg, sometimes mixed together with Fioricet and Zofran, complain of GI side effect, has to lie down in bed for half day.  She was given Aimogiv as migraine prevention,    REVIEW OF SYSTEMS: Full 14 system review of systems performed and notable only for dizziness, headaches, sleepiness, anxiety, constipation, skin sensitivity, fatigue All rest review of systems were negative.   ALLERGIES: No Known Allergies  HOME MEDICATIONS: Current Outpatient Medications  Medication Sig Dispense Refill  . Azelastine HCl 137 MCG/SPRAY SOLN Place 1 spray into the nose 2 (two) times daily. 30 mL 5  . B Complex-Biotin-FA (B-COMPLEX PO) Take by mouth.    . butalbital-acetaminophen-caffeine (FIORICET, ESGIC) 50-325-40 MG tablet TAKE 1 TABLET BY MOUTH AS NEEDED FOR MIGRAINE (ONE MONTH SUPPLY)  3  . diphenhydrAMINE (BENADRYL) 25 mg capsule Take 25 mg by mouth every 6 (six) hours as needed for allergies.    Dorise Hiss (AIMOVIG) 70 MG/ML SOAJ Inject 1 Dose into the skin every 30 (thirty) days. Bring to physician's office for initial dose instructions. 1 mL  5  . fluticasone (FLONASE) 50 MCG/ACT nasal spray Place 1 spray into both nostrils daily. 16 g 5  . loratadine (CLARITIN) 10 MG tablet Take 1 tablet (10 mg total) by mouth daily. 30 tablet 5  . MAGNESIUM PO Take by mouth.    . meclizine (ANTIVERT) 12.5 MG tablet Take one tablet twice daily. Can increase to 4 times daily if needed. 120 tablet 2  . montelukast (SINGULAIR) 10 MG tablet Take 1 tablet (10 mg total) by mouth at bedtime. 30 tablet 5  . Multiple Vitamins-Minerals (MULTIVITAMIN PO) Take by mouth daily.    . ondansetron (ZOFRAN ODT) 4 MG disintegrating tablet Take 1 tablet (4 mg total) by mouth every 8 (eight) hours as needed. 20 tablet 11  . topiramate (TOPAMAX) 100 MG tablet Take 1 tablet (100 mg total) by mouth 2 (two) times daily. 60 tablet 11   No current facility-administered medications for this visit.     PAST MEDICAL HISTORY: Past Medical History:  Diagnosis Date  . Allergic rhinitis   . Migraines     PAST SURGICAL HISTORY: Past Surgical History:  Procedure Laterality Date  . CESAREAN SECTION  2015  . TONSILLECTOMY      FAMILY HISTORY: Family History  Problem Relation Age of Onset  . Migraines Mother   . Kidney disease Mother   . Stroke Mother   . Hypercholesterolemia Mother   . Hypertension Mother   . Lung disease Father   . Seizures Father   . Allergic rhinitis Maternal Grandmother   . Heart attack Maternal Aunt   . Migraines Maternal Aunt   . Migraines Maternal Aunt   . Migraines Maternal Uncle   . Alzheimer's disease Paternal Grandmother     SOCIAL HISTORY:  Social History   Socioeconomic History  . Marital status: Legally Separated    Spouse name: Not on file  . Number of children: 2  . Years of education: some college  . Highest education level: Not on file  Occupational History  . Occupation: Futures trader  Social Needs  . Financial resource strain: Not on file  . Food insecurity:    Worry: Not on file    Inability: Not on file  .  Transportation needs:    Medical: Not on file    Non-medical: Not on file  Tobacco Use  . Smoking status: Never Smoker  . Smokeless tobacco: Never Used  Substance and Sexual Activity  . Alcohol use: No  . Drug use: No  . Sexual activity: Yes    Birth control/protection: None  Lifestyle  . Physical activity:    Days per week: Not on file    Minutes per session: Not on file  . Stress: Not on file  Relationships  . Social connections:    Talks on phone: Not on file    Gets together: Not on file    Attends religious service: Not on file    Active member of club or organization: Not on file    Attends meetings of clubs or organizations: Not on file    Relationship status: Not on file  . Intimate partner violence:    Fear of current or ex partner: Not on file  Emotionally abused: Not on file    Physically abused: Not on file    Forced sexual activity: Not on file  Other Topics Concern  . Not on file  Social History Narrative   Lives at home with mother, daughter and son.   Right-handed.   Occasional soda.     PHYSICAL EXAM   Vitals:   07/25/18 1109  BP: 132/82  Pulse: 96  Weight: 137 lb 8 oz (62.4 kg)  Height: 5\' 1"  (1.549 m)    Not recorded      Body mass index is 25.98 kg/m.  PHYSICAL EXAMNIATION:  Gen: NAD, conversant, well nourised, obese, well groomed                     Cardiovascular: Regular rate rhythm, no peripheral edema, warm, nontender. Eyes: Conjunctivae clear without exudates or hemorrhage Neck: Supple, no carotid bruits. Pulmonary: Clear to auscultation bilaterally   NEUROLOGICAL EXAM:  MENTAL STATUS: Speech:    Speech is normal; fluent and spontaneous with normal comprehension.  Cognition:     Orientation to time, place and person     Normal recent and remote memory     Normal Attention span and concentration     Normal Language, naming, repeating,spontaneous speech     Fund of knowledge   CRANIAL NERVES: CN II: Visual fields are  full to confrontation. Fundoscopic exam is normal with sharp discs and no vascular changes. Pupils are round equal and briskly reactive to light. CN III, IV, VI: extraocular movement are normal. No ptosis. CN V: Facial sensation is intact to pinprick in all 3 divisions bilaterally. Corneal responses are intact.  CN VII: Face is symmetric with normal eye closure and smile. CN VIII: Hearing is normal to rubbing fingers CN IX, X: Palate elevates symmetrically. Phonation is normal. CN XI: Head turning and shoulder shrug are intact CN XII: Tongue is midline with normal movements and no atrophy.  MOTOR: There is no pronator drift of out-stretched arms. Muscle bulk and tone are normal. Muscle strength is normal.  REFLEXES: Reflexes are 2+ and symmetric at the biceps, triceps, knees, and ankles. Plantar responses are flexor.  SENSORY: Intact to light touch, pinprick, positional sensation and vibratory sensation are intact in fingers and toes.  COORDINATION: Rapid alternating movements and fine finger movements are intact. There is no dysmetria on finger-to-nose and heel-knee-shin.    GAIT/STANCE: Posture is normal. Gait is steady with normal steps, base, arm swing, and turning. Heel and toe walking are normal. Tandem gait is normal.  Romberg is absent.   DIAGNOSTIC DATA (LABS, IMAGING, TESTING) - I reviewed patient records, labs, notes, testing and imaging myself where available.   ASSESSMENT AND PLAN  Ashelyn Pedley is a 32 y.o. female   Chronic migraine headaches  Try and failed preventive medications Effexor 75 mg  Keep topamax 100mg  bid  Botox injection preauthorization as migraine prevention on April 24 2018, did not help her.  Could not tolerate triptans,   Try Fioricet or Cambian as needed.  Constellation of symptoms, daily headaches,  She desires further evaluation, proceed with MRI of the brain  Levert Feinstein, M.D. Ph.D.  [facility] [address], [address] [place], [place] [address] Ph: [phone] Fax: [phone]  CC: Eunice Blase, [place]

## 2018-07-25 NOTE — Telephone Encounter (Signed)
Patient is aware she has GI phone number and will call at [phone] if she has not heard in the next 2-3 business days.

## 2018-07-31 ENCOUNTER — Ambulatory Visit: Payer: Self-pay | Admitting: Neurology

## 2018-08-06 ENCOUNTER — Ambulatory Visit
Admission: RE | Admit: 2018-08-06 | Discharge: 2018-08-06 | Disposition: A | Discharge status: Home / Self Care | Payer: Medicaid Other | Source: Ambulatory Visit | Attending: Neurology | Admitting: Neurology

## 2018-08-06 DIAGNOSIS — R519 Headache, unspecified: Secondary | ICD-10-CM

## 2018-08-06 DIAGNOSIS — R51 Headache: Secondary | ICD-10-CM | POA: Diagnosis not present

## 2018-08-08 ENCOUNTER — Telehealth: Payer: Self-pay | Admitting: *Deleted

## 2018-08-08 NOTE — Telephone Encounter (Signed)
I called and spoke with pt about results. Pt verbalized understanding.   She is asking if she can get more fioricet. Advised Dr. Terrace ArabiaYan only gives 10 per month. Taking more can cause rebound headaches. Asked if she picked up Cambia. She states pharmacy did not receive. She will check again with them and call back if anything further needed.

## 2018-08-08 NOTE — Telephone Encounter (Signed)
-----   Message from Levert FeinsteinYijun Yan, MD sent at 08/08/2018  8:32 AM EST ----- Please call pt for normal MRI brain.

## 2018-09-10 ENCOUNTER — Other Ambulatory Visit: Payer: Self-pay | Admitting: Neurology

## 2018-10-14 ENCOUNTER — Other Ambulatory Visit: Payer: Self-pay | Admitting: *Deleted

## 2018-10-14 ENCOUNTER — Other Ambulatory Visit: Payer: Self-pay | Admitting: Neurology

## 2018-10-14 MED ORDER — BUTALBITAL-APAP-CAFFEINE 50-325-40 MG PO TABS: ORAL_TABLET | ORAL | 3 refills | Status: AC

## 2018-11-13 ENCOUNTER — Encounter: Payer: Self-pay | Admitting: Allergy and Immunology

## 2018-11-13 ENCOUNTER — Ambulatory Visit (INDEPENDENT_AMBULATORY_CARE_PROVIDER_SITE_OTHER): Payer: Medicaid Other | Admitting: Allergy and Immunology

## 2018-11-13 VITALS — BP 126/80 | HR 78 | Resp 16

## 2018-11-13 DIAGNOSIS — J3089 Other allergic rhinitis: Secondary | ICD-10-CM | POA: Diagnosis not present

## 2018-11-13 DIAGNOSIS — H6981 Other specified disorders of Eustachian tube, right ear: Secondary | ICD-10-CM | POA: Diagnosis not present

## 2018-11-13 DIAGNOSIS — R040 Epistaxis: Secondary | ICD-10-CM | POA: Diagnosis not present

## 2018-11-13 MED ORDER — AZELASTINE HCL 137 MCG/SPRAY NA SOLN: 1.0000 | Freq: Two times a day (BID) | NASAL | 5 refills | Status: AC

## 2018-11-13 MED ORDER — FLUTICASONE PROPIONATE 50 MCG/ACT NA SUSP: 1.0000 | Freq: Every day | NASAL | 5 refills | Status: AC

## 2018-11-13 MED ORDER — MUPIROCIN 2 % EX OINT: TOPICAL_OINTMENT | CUTANEOUS | 0 refills | Status: DC

## 2018-11-13 MED ORDER — MONTELUKAST SODIUM 10 MG PO TABS: 10.0000 mg | ORAL_TABLET | Freq: Every day | ORAL | 5 refills | Status: DC

## 2018-11-13 NOTE — Patient Instructions (Addendum)
  1.  Nasal saline followed by Bactroban ointment inside nose 3 times a day for 10 days  2. Prednisone 10mg  tablet daily for 5 days only  3.  Continue to Treat and prevent inflammation:   A.  Montelukast 10 mg tablet 1 time per day  B.  Decrease Flonase 1 spray each nostril 1 time per day  C.  Azelastine 1 spray each nostril 2 times per day   4. Return in summer 2020 or earlier if problem

## 2018-11-13 NOTE — Progress Notes (Signed)
Follow-up Note  Referring Provider: Eunice Blase, PA-C Primary Provider: Eunice Blase, PA-C Date of Office Visit: 11/13/2018  Subjective:   Sydney Crosby (DOB: [birth year]) is a 33 y.o. female who [facility] in re-evaluation of the following:  HPI: Sydney Crosby presents to this clinic in reevaluation of her allergic rhinitis and issues with vertigo and tinnitus and ETD and a history of headaches.  I last saw her in this clinic on 06 June 2018.  Her headaches are now under the care of a neurologist.  She is using occasional caffeine and occasional Fioricet and occasional triptan to treat this issue at this point.  She did try Periactin but it made her too sleepy.  For the past week or so she has been having some nasal stuffiness and some sneezing and she has noticed nasal blood with some green nasal discharge especially on the right side.  She has not had any fever or decreased ability to smell.  She does feel as though her right ear is blocked up which is a common phenomenon for her.  She has seen an ENT in the past regarding ETD on that side and has had ear ventilation tubes when she was much younger.  Allergies as of 11/13/2018      Reactions   Penicillins Nausea And Vomiting      Medication List      Azelastine HCl 137 MCG/SPRAY Soln Place 1 spray into the nose 2 (two) times daily.   butalbital-acetaminophen-caffeine 50-325-40 MG tablet Commonly known as:  FIORICET, ESGIC Take one tab daily, as needed, for migraine.  #10/30.  No early refills.   diphenhydrAMINE 25 mg capsule Commonly known as:  BENADRYL Take 25 mg by mouth every 6 (six) hours as needed for allergies.   fluticasone 50 MCG/ACT nasal spray Commonly known as:  FLONASE Place 1 spray into both nostrils daily.   loratadine 10 MG tablet Commonly known as:  CLARITIN Take 1 tablet (10 mg total) by mouth daily.   MAGNESIUM PO Take by mouth.   meclizine 12.5 MG  tablet Commonly known as:  ANTIVERT Take one tablet twice daily. Can increase to 4 times daily if needed.   montelukast 10 MG tablet Commonly known as:  SINGULAIR Take 1 tablet (10 mg total) by mouth at bedtime.   ondansetron 4 MG disintegrating tablet Commonly known as:  ZOFRAN ODT Take 1 tablet (4 mg total) by mouth every 8 (eight) hours as needed.   PROBIOTIC-10 PO Take 1 tablet by mouth at bedtime.   topiramate 100 MG tablet Commonly known as:  TOPAMAX Take 1 tablet (100 mg total) by mouth 2 (two) times daily.       Past Medical History:  Diagnosis Date  . Allergic rhinitis   . Migraines     Past Surgical History:  Procedure Laterality Date  . CESAREAN SECTION  2015  . TONSILLECTOMY      Review of systems negative except as noted in HPI / PMHx or noted below:  Review of Systems  Constitutional: Negative.   HENT: Negative.   Eyes: Negative.   Respiratory: Negative.   Cardiovascular: Negative.   Gastrointestinal: Negative.   Genitourinary: Negative.   Musculoskeletal: Negative.   Skin: Negative.   Neurological: Negative.   Endo/Heme/Allergies: Negative.   Psychiatric/Behavioral: Negative.      Objective:   Vitals:   11/13/18 1536  BP: 126/80  Pulse: 78  Resp: 16  SpO2: 97%  Physical Exam Constitutional:      Appearance: She is not diaphoretic.  HENT:     Head: Normocephalic.     Right Ear: Ear canal and external ear normal. A middle ear effusion (Lack of pneumatic movement) is present. Tympanic membrane is scarred.     Left Ear: Tympanic membrane, ear canal and external ear normal.     Nose: Mucosal edema (Excoriated right nasal septum with bleed) present. No rhinorrhea.     Mouth/Throat:     Pharynx: Uvula midline. No oropharyngeal exudate.  Eyes:     Conjunctiva/sclera: Conjunctivae normal.  Neck:     Thyroid: No thyromegaly.     Trachea: Trachea normal. No tracheal tenderness or tracheal deviation.  Cardiovascular:     Rate  and Rhythm: Normal rate and regular rhythm.     Heart sounds: Normal heart sounds, S1 normal and S2 normal. No murmur.  Pulmonary:     Effort: No respiratory distress.     Breath sounds: Normal breath sounds. No stridor. No wheezing or rales.  Lymphadenopathy:     Head:     Right side of head: No tonsillar adenopathy.     Left side of head: No tonsillar adenopathy.     Cervical: No cervical adenopathy.  Skin:    Findings: No erythema or rash.     Nails: There is no clubbing.   Neurological:     Mental Status: She is alert.     Diagnostics:  Results of a brain MRI obtained 06 August 2018 identified the following:   The orbits and their contents, paranasal sinuses and calvarium are unremarkable  Assessment and Plan:   1. Perennial allergic rhinitis   2. ETD (Eustachian tube dysfunction), right   3. Epistaxis     1.  Nasal saline followed by Bactroban ointment inside nose 3 times a day for 10 days  2.  Prednisone 10mg  tablet daily for 5 days only  3.  Continue to Treat and prevent inflammation:   A.  Montelukast 10 mg tablet 1 time per day  B.  Decrease Flonase 1 spray each nostril 1 time per day  C.  Azelastine 1 spray each nostril 2 times per day   4. Return in summer 2020 or earlier if problem  Sydney Crosby appears to have developed a possible viral respiratory tract infection and has developed some irritation of her right nasal septum and has ETD as well and I have given her some topical Bactroban and low-dose systemic steroid to address these issues.  Assuming she does well on a lower dose of nasal steroid and continues on other anti-inflammatory agents I will see her back in this clinic in the summer 2020 or earlier if there is a problem.  [name], MD Allergy / Immunology [facility]

## 2018-11-14 ENCOUNTER — Encounter: Payer: Self-pay | Admitting: Allergy and Immunology

## 2018-11-30 ENCOUNTER — Other Ambulatory Visit: Payer: Self-pay | Admitting: Allergy and Immunology

## 2018-12-31 ENCOUNTER — Telehealth: Payer: Self-pay | Admitting: Neurology

## 2019-01-02 ENCOUNTER — Ambulatory Visit (INDEPENDENT_AMBULATORY_CARE_PROVIDER_SITE_OTHER): Payer: Medicaid Other | Admitting: Neurology

## 2019-01-02 ENCOUNTER — Other Ambulatory Visit: Payer: Self-pay | Admitting: *Deleted

## 2019-01-02 ENCOUNTER — Encounter: Payer: Self-pay | Admitting: Neurology

## 2019-01-02 ENCOUNTER — Other Ambulatory Visit: Payer: Self-pay

## 2019-01-02 DIAGNOSIS — G43709 Chronic migraine without aura, not intractable, without status migrainosus: Secondary | ICD-10-CM

## 2019-01-02 DIAGNOSIS — IMO0002 Reserved for concepts with insufficient information to code with codable children: Secondary | ICD-10-CM

## 2019-01-02 MED ORDER — RIZATRIPTAN BENZOATE 10 MG PO TABS: 10.0000 mg | ORAL_TABLET | ORAL | 3 refills | Status: DC | PRN

## 2019-01-02 MED ORDER — ERENUMAB-AOOE 70 MG/ML ~~LOC~~ SOAJ: 70.0000 mg | SUBCUTANEOUS | 5 refills | Status: AC

## 2019-01-02 NOTE — Telephone Encounter (Signed)
Pt called in and stated she is taking med she stopped the others

## 2019-01-02 NOTE — Progress Notes (Signed)
Virtual Visit via Video Note  I connected with Sydney Crosby on 01/02/19 at  3:15 PM EDT by a video enabled telemedicine application and verified that I am speaking with the correct person using two identifiers.   I discussed the limitations of evaluation and management by telemedicine and the availability of in person appointments. The patient expressed understanding and agreed to proceed.  History of Present Illness: Sydney Crosby is a 33 year old female, seen in refer by her primary care PA O'Buch, Edgardo Roys, for evaluation of frequent migraine headache, initial evaluation was on December 03, 2017.  She reported history of migraine headaches since she was 33 years old, only happens occasionally, began to have gradual worsening migraine headaches since age 58, this was following her contraceptive treatment, especially since her recent childbirth in 2015, she complains of worsening headaches, every other days, using frequent over-the-counter herb oil, sometimes Tylenol, Excedrin migraines with limited help,  Previously she was given a prescription of Topamax, which has helped her headache, but she complains of intolerable side effect of paresthesia, mental slowing, propanolol, upset stomach, nortriptyline, unsure benefit,  She has tried Imitrex, cause blurry vision.   Her typical migraine is left side severe pounding headache with associated light noise sensitivity, nauseous, lasting for hours, 2 days, Aleve as needed seems to help her headaches.  Laboratory evaluations on November 2019, normal CBC hemoglobin of 12.4, CMP, creatinine of 0.75, LDL of 164, normal TSH  UPDATE March 07 2018: She has tried Effexor for 2 months, without significant improvement of her headaches, she stays home for her children at age 39, 15, is very frustrated about her frequent headaches at least twice a week, Zofran was helpful, but Maxalt was not helpful,  Her aunt has good response to Botox injection as migraine  prevention, that is what she wants to,  She has frequent migraine headaches, lateralized severe pounding headaches light noise sensitive, nauseous, lasting for more than 4 hours, sometimes 2 days,    UPDATE April 24 2018: This is her first BOTOX injection, potential side effect explained.  UPDATE Jul 25 2018: Only in the first Botox injection on April 24, 2018 do not help her, seems to make her constellation of symptoms much worse, She only has two good days over past 3 months, now she has constant ringing in her ear, overwhelming, nauseous, was seen by ENT that was normal.  She did have anxiety, which is much worse now, was seen by her primary care physician wellbutrin, buspar, lexapro, clonazepam, did not help, previously also could not tolerate Effexor  If she gets a migraine, she has overwhelming anxiety, she took her aunt's xanax with ibuprofen 800 mg, sometimes mixed together with Fioricet and Zofran, complain of GI side effect, has to lie down in bed for half day.  She was given Aimogiv as migraine prevention  Update January 02 2019 SS: She has tried and failed preventive medications, Effexor 75 mg, she continues to take Topamax 100 mg twice a day, tried Botox, reported was not helpful, not able to tolerate triptans, using Fioricet and Cambia as needed.  MRI of the brain in November 2019 was unremarkable.  Today she is calling for a refill for her Maxalt.  She reports she has been on Maxalt in the past, was switched to Fioricet.  She reports that she is having more frequent headaches related to stress, pollen.  She describes her headaches as starting in the back of her neck and then spreading frontal.  Her primary care  doctor has prescribed her 800 mg ibuprofen twice a day as needed for her neck pain.  She reports that whenever she feels pain coming on she may treat with ibuprofen, if she does not treat her neck pain will progress to a migraine.  She is taking ibuprofen maybe twice a week,  Maxalt twice a week.  She reports she will use Fioricet during the day if she has to care for her children.  The Maxalt makes her feel very tired.  In the past she has been on Aimovig for 1 injection, reports it was beneficial, tolerated medication.  She did get 1 round of Botox, she became nervous anxious about needles.  She was afraid to try Aimovig again because of the needle.   Observations/Objective: Alert answers questions appropriately, speech is clear and concise, knowledgeable health condition  Assessment and Plan: 1.  Headaches  She will continue taking Topamax 100 mg twice daily.  She reports her headaches have benefited from the Topamax.  We will restart Aimovig 70 mg monthly injection.  She has had this 1 time before, it was beneficial, she tolerated it well.  I will send in a prescription for Maxalt 10 mg for acute headache.  I advised her that she should she use Fioricet or Maxalt, she should not use both, she will stop fiorcet.  The goal is to obtain better headache control, so she is not having to use her acute medications.  She should avoid Fioricet, ibuprofen because of risk of rebound headache.  I advised her that if her symptoms worsen or she develops any new symptoms she should let us know.  Follow Up Instructions: 3 months for revisit   I discussed the assessment and treatment plan with the patient. The patient was provided an opportunity to ask questions and all were answered. The patient agreed with the plan and demonstrated an understanding of the instructions.   The patient was advised to call back or seek an in-person evaluation if the symptoms worsen or if the condition fails to improve as anticipated.  I provided 20 minutes of non-face-to-face time during this encounter.   Otila KluverSarah , AGNP-C, DNP  [facility] [address], [address] [place], [place] [address] [phone]

## 2019-01-02 NOTE — Telephone Encounter (Addendum)
I returned the call to the patient.  She has recently had an increase in migraines and feels it is due to pollen.  She has continued taking topiramate 100mg , one capsule BID for prevention.  She previously reported not liking the way rizatriptan made her feel but has restarted it with benefit.  The concern is that she is also still using Fiorcet prn and more recently she received a prescription for ibuprofen 800mg , one tablet BID prn (from her PCP).  She was cautioned about medication overuse headaches and how difficult they can be to treat.  I also discussed the importance of calling our office for migraine management rather than seeking additional care from her PCP.  This way only one provider is managing her headaches and this may prevent causing her more problems.   She had a pending appt with Margie Ege, NP on 01/23/2019.  Due to the information above, she has consented to a virtual visit today to discuss her migraine management in detail.  She is aware that the appropriate medication refills will be provided after further discussion with the provider.

## 2019-01-06 NOTE — Progress Notes (Signed)
I have reviewed and agreed above plan. 

## 2019-01-13 ENCOUNTER — Ambulatory Visit: Payer: Self-pay | Admitting: Allergy and Immunology

## 2019-01-13 ENCOUNTER — Ambulatory Visit (INDEPENDENT_AMBULATORY_CARE_PROVIDER_SITE_OTHER): Payer: Medicaid Other | Admitting: Allergy and Immunology

## 2019-01-13 ENCOUNTER — Other Ambulatory Visit: Payer: Self-pay

## 2019-01-13 DIAGNOSIS — J340 Abscess, furuncle and carbuncle of nose: Secondary | ICD-10-CM

## 2019-01-13 DIAGNOSIS — G43909 Migraine, unspecified, not intractable, without status migrainosus: Secondary | ICD-10-CM

## 2019-01-13 DIAGNOSIS — J3089 Other allergic rhinitis: Secondary | ICD-10-CM

## 2019-01-13 MED ORDER — MUPIROCIN 2 % EX OINT: TOPICAL_OINTMENT | CUTANEOUS | 0 refills | Status: AC

## 2019-01-13 NOTE — Progress Notes (Signed)
[place]   Follow-up Note  Referring Provider: Dulce SellarHudnell, Stephanie, NP Primary Provider: Dulce SellarHudnell, Stephanie, NP Date of Office Visit: 01/13/2019  Subjective:   [name] (DOB: Apr 23, 1986) is a 33 y.o. female who [facility] in re-evaluation of the following:  HPI: This is a E - Med visit requested by patient who is located at home.  Sydney Crosby is followed in this clinic for allergic rhinitis and a multitude of other issues including vertigo and tinnitus and ETD and a history of headaches.  Her last visit to this clinic was 13 November 2018 at which point in time she had excoriated right nasal septum with bleeding that we treated with topical therapy.  Headache, bloody nasal discharge, nasal congestion, this spring. This past Thursday felt bad with blood ugly green nasal discharge and given Augmentin and Prednisone. Augmentin, day three,  causing nausea without diarrhea. This year much worse with allergies with sneezing and nasal congestion. Definitely worse with outdoor exposure.   Never did acquire topical Bactroban during her last visit in the treatment of for what appeared to be septal ulceration.  Headaches followed by neurologist. Starting Aimovig soon.  Allergies as of 01/13/2019      Reactions   Penicillins Nausea And Vomiting      Medication List      Azelastine HCl 137 MCG/SPRAY Soln Place 1 spray into the nose 2 (two) times daily.   butalbital-acetaminophen-caffeine 50-325-40 MG tablet Commonly known as:  FIORICET Take one tab daily, as needed, for migraine.  #10/30.  No early refills.   diphenhydrAMINE 25 mg capsule Commonly known as:  BENADRYL Take 25 mg by mouth every 6 (six) hours as needed for allergies.   Erenumab-aooe 70 MG/ML Soaj Commonly known as:  Aimovig Inject 70 mg into the skin every 30 (thirty) days.   fluticasone 50 MCG/ACT nasal spray Commonly known as:   FLONASE Place 1 spray into both nostrils daily.   ibuprofen 800 MG tablet Commonly known as:  ADVIL TAKE 1 TABLET BY MOUTH TWICE DAILY FOR MIGRAINES   loratadine 10 MG tablet Commonly known as:  CLARITIN Take 1 tablet by mouth once daily   MAGNESIUM PO Take by mouth.   meclizine 12.5 MG tablet Commonly known as:  ANTIVERT Take one tablet twice daily. Can increase to 4 times daily if needed.   montelukast 10 MG tablet Commonly known as:  SINGULAIR Take 1 tablet (10 mg total) by mouth at bedtime.   mupirocin ointment 2 % Commonly known as:  Bactroban Swab inside the nose 3 times a day for 10 days.   ondansetron 4 MG disintegrating tablet Commonly known as:  Zofran ODT Take 1 tablet (4 mg total) by mouth every 8 (eight) hours as needed.   PROBIOTIC-10 PO Take 1 tablet by mouth at bedtime.   rizatriptan 10 MG tablet Commonly known as:  Maxalt Take 1 tablet (10 mg total) by mouth as needed for migraine. May repeat in 2 hours if needed, this is a 30 day prescription   topiramate 100 MG tablet Commonly known as:  TOPAMAX Take 1 tablet (100 mg total) by mouth 2 (two) times daily.       Past Medical History:  Diagnosis Date  . Allergic rhinitis   . Migraines     Past Surgical History:  Procedure Laterality Date  . CESAREAN SECTION  2015  . TONSILLECTOMY  Review of systems negative except as noted in HPI / PMHx or noted below:  Review of Systems  Constitutional: Negative.   HENT: Negative.   Eyes: Negative.   Respiratory: Negative.   Cardiovascular: Negative.   Gastrointestinal: Negative.   Genitourinary: Negative.   Musculoskeletal: Negative.   Skin: Negative.   Neurological: Negative.   Endo/Heme/Allergies: Negative.   Psychiatric/Behavioral: Negative.      Objective:   There were no vitals filed for this visit.        Physical Exam-deferred  Diagnostics: None  Assessment and Plan:   1. Perennial allergic rhinitis   2. Nasal septal  ulcer   3. Migraine syndrome     1. Continue to Treat and prevent inflammation:   A.  Montelukast 10 mg tablet 1 time per day  B.  Azelastine 1 spray each nostril 2 times per day  2. Do not restart Flonase at this point. Stop Augmentin. Finish prednisone.  3. Nasal saline followed by bactroban ointment in nose three times a day for 2 weeks.    4. Return in 2 weeks or earlier if problem  I am worried that Sydney Crosby has developed a problem with a nasal septal ulceration as she never did attend to the septal defect she had during her last visit and now she has lots of bloody green nasal discharge.  It sounds as though she is intolerant of Augmentin and we will treat her with topical Bactroban and she can finish out the prednisone prescribed by her primary care doctor.  We can have her discontinue Flonase completely instead of just lowering the dose as we did last time and she will continue to use montelukast and a nasal antihistamine.  I will see her back in this clinic in 2 weeks or earlier if there is a problem.  If she still has significant nasal defect at that point we will refer her onto ENT.  Total patient interaction time 20 minutes.  [name], MD Allergy / Immunology [facility]

## 2019-01-13 NOTE — Patient Instructions (Addendum)
  1. Continue to Treat and prevent inflammation:   A.  Montelukast 10 mg tablet 1 time per day  B.  Azelastine 1 spray each nostril 2 times per day  2. Do not restart Flonase at this point. Stop Augmentin. Finish prednisone.  3. Nasal saline followed by bactroban ointment in nose three times a day for 2 weeks.    4. Return in 2 weeks or earlier if problem

## 2019-01-14 ENCOUNTER — Encounter: Payer: Self-pay | Admitting: Allergy and Immunology

## 2019-01-14 NOTE — Progress Notes (Signed)
Patient is at home.  Provider is in the office.  Start time: 2:27 pm End Time: 2:52 pm Verbal consent to bill insurance given.

## 2019-01-23 ENCOUNTER — Ambulatory Visit: Payer: Medicaid Other | Admitting: Neurology

## 2019-01-27 ENCOUNTER — Encounter: Payer: Self-pay | Admitting: Allergy and Immunology

## 2019-01-27 ENCOUNTER — Ambulatory Visit (INDEPENDENT_AMBULATORY_CARE_PROVIDER_SITE_OTHER): Payer: Medicaid Other | Admitting: Allergy and Immunology

## 2019-01-27 ENCOUNTER — Other Ambulatory Visit: Payer: Self-pay

## 2019-01-27 VITALS — BP 118/84 | HR 72 | Resp 20

## 2019-01-27 DIAGNOSIS — J3089 Other allergic rhinitis: Secondary | ICD-10-CM | POA: Diagnosis not present

## 2019-01-27 DIAGNOSIS — G43909 Migraine, unspecified, not intractable, without status migrainosus: Secondary | ICD-10-CM | POA: Diagnosis not present

## 2019-01-27 DIAGNOSIS — H6981 Other specified disorders of Eustachian tube, right ear: Secondary | ICD-10-CM

## 2019-01-27 DIAGNOSIS — J340 Abscess, furuncle and carbuncle of nose: Secondary | ICD-10-CM

## 2019-01-27 MED ORDER — MOMETASONE FUROATE 50 MCG/ACT NA SUSP: NASAL | 5 refills | Status: AC

## 2019-01-27 NOTE — Patient Instructions (Addendum)
  1. Continue to Treat and prevent inflammation:   A.  Montelukast 10 mg tablet 1 time per day  B.  Generic Nasonex - 1 spray each nostril 1 time per day.  2. If needed:   A. Azelastine - 1 spray each nostril 2 times per day  B. Nasal saline  3. Strongly consider starting your Aimovig monthly  4. Very gentle nose blowing if needed  5. Blood - CBC w/diff, Area 2 profile. Immunotherapy?  6. Return to clinic in summer 2020 of earlier if problem

## 2019-01-27 NOTE — Progress Notes (Signed)
[place] - [place]   Follow-up Note  Referring Provider: Dulce SellarHudnell, Stephanie, NP Primary Provider: Dulce SellarHudnell, Stephanie, NP Date of Office Visit: 01/27/2019  Subjective:   [name] (DOB: 04/18/1986) is a 33 y.o. female who [facility] on 01/27/2019 in re-evaluation of the following:   HPI: Sydney Crosby returns to this clinic in reevaluation of her allergic rhinitis and history of ETD and chronic daily headache.  I last saw her in this clinic on 13 January 2019 via a E - Med visit at which point in time she was having bloody nasal discharge and rhinitis symptoms.  We discontinued her Augmentin during that e- med visit because she was developing significant problems with nausea.  Apparently she was given that Augmentin and prednisone for what was presumed to be sinusitis by her primary care doctor.  At this point she does have some issues with sneezing and some clear rhinorrhea but does not have any ugly bloody nasal discharge that she has had in the past.  Part of the issue is that she had a septal ulcer and she did not attend to that ulcer by using the prescribed therapy of topical Bactroban and stopping her Flonase.  Fortunately, with that last E - Med visit we did convince her to use the Bactroban and stop her Flonase and her nose is much better at this point.  Her ears do feel a little bit full at this point in time.  She still continues with very significant headaches and is very hesitant to start her Aimovig because of her previous reaction to her Botox injections.  Allergies as of 01/27/2019      Reactions   Penicillins Nausea And Vomiting   Sumatriptan Succinate Palpitations      Medication List      Azelastine HCl 137 MCG/SPRAY Soln Place 1 spray into the nose 2 (two) times daily.   butalbital-acetaminophen-caffeine 50-325-40 MG tablet Commonly known as:  FIORICET Take one tab daily, as needed, for migraine.   #10/30.  No early refills.   diphenhydrAMINE 25 mg capsule Commonly known as:  BENADRYL Take 25 mg by mouth every 6 (six) hours as needed for allergies.   Erenumab-aooe 70 MG/ML Soaj Commonly known as:  Aimovig Inject 70 mg into the skin every 30 (thirty) days.   fluticasone 50 MCG/ACT nasal spray Commonly known as:  FLONASE Place 1 spray into both nostrils daily.   ibuprofen 800 MG tablet Commonly known as:  ADVIL TAKE 1 TABLET BY MOUTH TWICE DAILY FOR MIGRAINES   loratadine 10 MG tablet Commonly known as:  CLARITIN Take 1 tablet by mouth once daily   meclizine 12.5 MG tablet Commonly known as:  ANTIVERT Take one tablet twice daily. Can increase to 4 times daily if needed.   montelukast 10 MG tablet Commonly known as:  SINGULAIR Take 1 tablet (10 mg total) by mouth at bedtime.   mupirocin ointment 2 % Commonly known as:  Bactroban Swab inside the nose 3 times a day for 10 days.   ondansetron 4 MG disintegrating tablet Commonly known as:  Zofran ODT Take 1 tablet (4 mg total) by mouth every 8 (eight) hours as needed.   PROBIOTIC-10 PO Take 1 tablet by mouth at bedtime.   rizatriptan 10 MG tablet Commonly known as:  Maxalt Take 1 tablet (10 mg total) by mouth as needed for migraine. May repeat in 2 hours if needed, this is a 6330  day prescription   topiramate 100 MG tablet Commonly known as:  TOPAMAX Take 1 tablet (100 mg total) by mouth 2 (two) times daily.       Past Medical History:  Diagnosis Date  . Allergic rhinitis   . Migraines     Past Surgical History:  Procedure Laterality Date  . CESAREAN SECTION  2015  . TONSILLECTOMY      Review of systems negative except as noted in HPI / PMHx or noted below:  Review of Systems  Constitutional: Negative.   HENT: Negative.   Eyes: Negative.   Respiratory: Negative.   Cardiovascular: Negative.   Gastrointestinal: Negative.   Genitourinary: Negative.   Musculoskeletal: Negative.   Skin: Negative.    Neurological: Negative.   Endo/Heme/Allergies: Negative.   Psychiatric/Behavioral: Negative.      Objective:   Vitals:   01/27/19 1541  BP: 118/84  Pulse: 72  Resp: 20          Physical Exam Constitutional:      Appearance: She is not diaphoretic.  HENT:     Head: Normocephalic.     Right Ear: Ear canal and external ear normal. Tympanic membrane is scarred.     Left Ear: Tympanic membrane, ear canal and external ear normal.     Nose: Nose normal. No mucosal edema or rhinorrhea.     Mouth/Throat:     Pharynx: Uvula midline. No oropharyngeal exudate.  Eyes:     Conjunctiva/sclera: Conjunctivae normal.  Neck:     Thyroid: No thyromegaly.     Trachea: Trachea normal. No tracheal tenderness or tracheal deviation.  Cardiovascular:     Rate and Rhythm: Normal rate and regular rhythm.     Heart sounds: Normal heart sounds, S1 normal and S2 normal. No murmur.  Pulmonary:     Effort: No respiratory distress.     Breath sounds: Normal breath sounds. No stridor. No wheezing or rales.  Lymphadenopathy:     Head:     Right side of head: No tonsillar adenopathy.     Left side of head: No tonsillar adenopathy.     Cervical: No cervical adenopathy.  Skin:    Findings: No erythema or rash.     Nails: There is no clubbing.   Neurological:     Mental Status: She is alert.     Diagnostics: none  Assessment and Plan:   1. Perennial allergic rhinitis   2. Nasal septal ulcer   3. Migraine syndrome   4. ETD (Eustachian tube dysfunction), right     1. Continue to Treat and prevent inflammation:   A.  Montelukast 10 mg tablet 1 time per day  B.  Generic Nasonex - 1 spray each nostril 1 time per day.  2. If needed:   A. Azelastine - 1 spray each nostril 2 times per day  B. Nasal saline  3. Strongly consider starting your Aimovig monthly  4. Very gentle nose blowing if needed  5. Blood - CBC w/diff, Area 2 profile. Immunotherapy?  6. Return to clinic in summer 2020  of earlier if problem  We will have [name] start Nasonex to replace her nasal fluticasone as obviously nasal fluticasone is a little bit too harsh for her nasal mucosa.  I do not see any signs of infection today and we will hold off on any antibiotics.  I think this is mostly an issue with inflammation of her mucosa with both rhinitis and ETD.  I would like for her to  get some blood tests in investigation of atopic disease and see if she is a candidate for immunotherapy.  I will contact her with the results of her blood test once they are available for review.   [name], MD Allergy / Immunology [facility]

## 2019-01-28 ENCOUNTER — Encounter: Payer: Self-pay | Admitting: Allergy and Immunology

## 2019-01-30 ENCOUNTER — Other Ambulatory Visit: Payer: Self-pay | Admitting: *Deleted

## 2019-01-30 ENCOUNTER — Telehealth: Payer: Self-pay | Admitting: *Deleted

## 2019-01-30 LAB — ALLERGENS W/TOTAL IGE AREA 2

## 2019-01-30 LAB — CBC WITH DIFFERENTIAL/PLATELET
Basophils Absolute: 0 10*3/uL (ref 0.0–0.2)
Basos: 0 %
EOS (ABSOLUTE): 0 10*3/uL (ref 0.0–0.4)
Eos: 0 %
Hematocrit: 35.3 % (ref 34.0–46.6)
Hemoglobin: 11.6 g/dL (ref 11.1–15.9)
Lymphocytes Absolute: 2.5 10*3/uL (ref 0.7–3.1)
Lymphs: 26 %
MCH: 24.3 pg — ABNORMAL LOW (ref 26.6–33.0)
MCHC: 32.9 g/dL (ref 31.5–35.7)
MCV: 74 fL — ABNORMAL LOW (ref 79–97)
Monocytes Absolute: 0.9 10*3/uL (ref 0.1–0.9)
Monocytes: 9 %
Neutrophils Absolute: 6.1 10*3/uL (ref 1.4–7.0)
Neutrophils: 65 %
Platelets: 289 10*3/uL (ref 150–450)
RBC: 4.77 x10E6/uL (ref 3.77–5.28)
RDW: 15 % (ref 11.7–15.4)
WBC: 9.6 10*3/uL (ref 3.4–10.8)

## 2019-01-30 MED ORDER — RIZATRIPTAN BENZOATE 10 MG PO TBDP: 10.0000 mg | ORAL_TABLET | ORAL | 11 refills | Status: AC | PRN

## 2019-01-30 NOTE — Telephone Encounter (Signed)
Pt picked up and I made 3 mo f/u appt with her.  She then asked about her rizatriptan 10mg  tabs she would like to go back to the odt type that she was on before.  I relayed that can place new prescription.

## 2019-02-18 NOTE — Telephone Encounter (Signed)
error 

## 2019-04-01 ENCOUNTER — Ambulatory Visit: Payer: Self-pay | Admitting: Neurology

## 2019-04-09 NOTE — Progress Notes (Deleted)
PATIENT: Sydney Crosby DOB: May 20, 1986  REASON FOR VISIT: follow up HISTORY FROM: patient  HISTORY OF PRESENT ILLNESS: Today 04/09/19  HISTORY  Iysha Barhamis a 33 year old female, seen in refer by her primary care PA O'Buch, Greta, for evaluation of frequent migraine headache, initial evaluation was on December 03, 2017.  She reported history of migraine headaches since she was 33 years old, only happens occasionally, began to have gradual worsening migraine headaches since age 75, this was following her contraceptive treatment, especially since her recent childbirth in 2015, she complains of worsening headaches, every other days, using frequent over-the-counter herb oil, sometimes Tylenol, Excedrin migraines with limited help,  Previously she was given a prescription of Topamax, which has helped her headache, but she complains of intolerable side effect of paresthesia, mental slowing, propanolol, upset stomach, nortriptyline, unsure benefit,  She has tried Imitrex, cause blurry vision.  Her typical migraine is left side severe pounding headache with associated light noise sensitivity, nauseous, lasting for hours, 2 days, Aleve as needed seems to help her headaches.  Laboratory evaluations on November 2019, normal CBC hemoglobin of 12.4, CMP, creatinine of 0.75, LDL of 164, normal TSH  UPDATE March 07 2018: She has tried Effexor for 2 months, without significant improvement of her headaches, she stays home for her children at age 29, 2, is very frustrated about her frequent headaches at least twice a week, Zofran was helpful, but Maxalt was not helpful,  Her aunt has good response to Botox injection as migraine prevention, that is what she wants to,  She has frequent migraine headaches, lateralized severe pounding headaches light noise sensitive, nauseous, lasting for more than 4 hours, sometimes 2 days,   UPDATE April 24 2018: This is her first BOTOX injection, potential  side effect explained.  UPDATE Jul 25 2018: Only in the first Botox injection on April 24, 2018 do not help her, seems to make her constellation of symptoms much worse, She only has two good days over past 3 months, now she has constant ringing in her ear, overwhelming, nauseous, was seen by ENT that was normal.  She did have anxiety,which is much worse now, was seen by her primary care physician wellbutrin, buspar, lexapro, clonazepam,did not help, previously also could not tolerate Effexor  If she gets a migraine, she has overwhelming anxiety, she took her aunt's xanax with ibuprofen800 mg, sometimes mixed together with Fioricet and Zofran, complain of GI side effect, has to lie down in bed for half day.  She was given Aimogiv as migraine prevention  Update January 02 2019 SS: She has tried and failed preventive medications, Effexor 75 mg, she continues to take Topamax 100 mg twice a day, tried Botox, reported was not helpful, not able to tolerate triptans, using Fioricet and Cambia as needed.  MRI of the brain in November 2019 was unremarkable.  Today she is calling for a refill for her Maxalt.  She reports she has been on Maxalt in the past, was switched to [place].  She reports that she is having more frequent headaches related to stress, pollen.  She describes her headaches as starting in the back of her neck and then spreading frontal.  Her primary care doctor has prescribed her 800 mg ibuprofen twice a day as needed for her neck pain.  She reports that whenever she feels pain coming on she may treat with ibuprofen, if she does not treat her neck pain will progress to a migraine.  She is taking ibuprofen  maybe twice a week, Maxalt twice a week.  She reports she will use Fioricet during the day if she has to care for her children.  The Maxalt makes her feel very tired.  In the past she has been on Aimovig for 1 injection, reports it was beneficial, tolerated medication.  She did get 1 round  of Botox, she became nervous anxious about needles.  She was afraid to try Aimovig again because of the needle.  Update April 10, 2019 SS: 33 year old female history of migraines, taking Topamax 100 mg daily, Aimovig 70 mg monthly injection.  She tried Botox in the past, did not feel it was helpful.  She remains on Maxalt as needed.  REVIEW OF SYSTEMS: Out of a complete 14 system review of symptoms, the patient complains only of the following symptoms, and all other reviewed systems are negative.  ALLERGIES: Allergies  Allergen Reactions   Penicillins Nausea And Vomiting   Sumatriptan Succinate Palpitations    HOME MEDICATIONS: Outpatient Medications Prior to Visit  Medication Sig Dispense Refill   Azelastine HCl 137 MCG/SPRAY SOLN Place 1 spray into the nose 2 (two) times daily. 30 mL 5   butalbital-acetaminophen-caffeine (FIORICET, ESGIC) 50-325-40 MG tablet Take one tab daily, as needed, for migraine.  #10/30.  No early refills. 10 tablet 3   diphenhydrAMINE (BENADRYL) 25 mg capsule Take 25 mg by mouth every 6 (six) hours as needed for allergies.     Erenumab-aooe (AIMOVIG) 70 MG/ML SOAJ Inject 70 mg into the skin every 30 (thirty) days. (Patient not taking: Reported on 01/27/2019) 1 pen 5   fluticasone (FLONASE) 50 MCG/ACT nasal spray Place 1 spray into both nostrils daily. (Patient not taking: Reported on 01/27/2019) 16 g 5   ibuprofen (ADVIL,MOTRIN) 800 MG tablet TAKE 1 TABLET BY MOUTH TWICE DAILY FOR MIGRAINES     loratadine (CLARITIN) 10 MG tablet Take 1 tablet by mouth once daily 30 tablet 0   meclizine (ANTIVERT) 12.5 MG tablet Take one tablet twice daily. Can increase to 4 times daily if needed. 120 tablet 2   mometasone (NASONEX) 50 MCG/ACT nasal spray Use one spray in each nostril once daily. 17 g 5   montelukast (SINGULAIR) 10 MG tablet Take 1 tablet (10 mg total) by mouth at bedtime. 30 tablet 5   mupirocin ointment (BACTROBAN) 2 % Swab inside the nose 3 times a day  for 10 days. (Patient not taking: Reported on 01/27/2019) 22 g 0   ondansetron (ZOFRAN ODT) 4 MG disintegrating tablet Take 1 tablet (4 mg total) by mouth every 8 (eight) hours as needed. 20 tablet 11   Probiotic Product (PROBIOTIC-10 PO) Take 1 tablet by mouth at bedtime.     rizatriptan (MAXALT-MLT) 10 MG disintegrating tablet Take 1 tablet (10 mg total) by mouth as needed for migraine (max 2/ 24hours). May repeat in 2 hours if needed 9 tablet 11   topiramate (TOPAMAX) 100 MG tablet Take 1 tablet (100 mg total) by mouth 2 (two) times daily. 60 tablet 11   No facility-administered medications prior to visit.     PAST MEDICAL HISTORY: Past Medical History:  Diagnosis Date   Allergic rhinitis    Migraines     PAST SURGICAL HISTORY: Past Surgical History:  Procedure Laterality Date   CESAREAN SECTION  2015   TONSILLECTOMY      FAMILY HISTORY: Family History  Problem Relation Age of Onset   Migraines Mother    Kidney disease Mother    Stroke Mother  Hypercholesterolemia Mother    Hypertension Mother    Lung disease Father    Seizures Father    Allergic rhinitis Maternal Grandmother    Heart attack Maternal Aunt    Migraines Maternal Aunt    Migraines Maternal Aunt    Migraines Maternal Uncle    Alzheimer's disease Paternal Grandmother     SOCIAL HISTORY: Social History   Socioeconomic History   Marital status: Legally Separated    Spouse name: Not on file   Number of children: 2   Years of education: some college   Highest education level: Not on file  Occupational History   Occupation: Clinical cytogeneticistHomemaker  Social Needs   Financial resource strain: Not on file   Food insecurity    Worry: Not on file    Inability: Not on file   Transportation needs    Medical: Not on file    Non-medical: Not on file  Tobacco Use   Smoking status: Never Smoker   Smokeless tobacco: Never Used  Substance and Sexual Activity   Alcohol use: No   Drug  use: No   Sexual activity: Yes    Birth control/protection: None  Lifestyle   Physical activity    Days per week: Not on file    Minutes per session: Not on file   Stress: Not on file  Relationships   Social connections    Talks on phone: Not on file    Gets together: Not on file    Attends religious service: Not on file    Active member of club or organization: Not on file    Attends meetings of clubs or organizations: Not on file    Relationship status: Not on file   Intimate partner violence    Fear of current or ex partner: Not on file    Emotionally abused: Not on file    Physically abused: Not on file    Forced sexual activity: Not on file  Other Topics Concern   Not on file  Social History Narrative   Lives at home with mother, daughter and son.   Right-handed.   Occasional soda.      PHYSICAL EXAM  There were no vitals filed for this visit. There is no height or weight on file to calculate BMI.  Generalized: Well developed, in no acute distress   Neurological examination  Mentation: Alert oriented to time, place, history taking. Follows all commands speech and language fluent Cranial nerve II-XII: Pupils were equal round reactive to light. Extraocular movements were full, visual field were full on confrontational test. Facial sensation and strength were normal. Uvula tongue midline. Head turning and shoulder shrug  were normal and symmetric. Motor: The motor testing reveals 5 over 5 strength of all 4 extremities. Good symmetric motor tone is noted throughout.  Sensory: Sensory testing is intact to soft touch on all 4 extremities. No evidence of extinction is noted.  Coordination: Cerebellar testing reveals good finger-nose-finger and heel-to-shin bilaterally.  Gait and station: Gait is normal. Tandem gait is normal. Romberg is negative. No drift is seen.  Reflexes: Deep tendon reflexes are symmetric and normal bilaterally.   DIAGNOSTIC DATA (LABS, IMAGING,  TESTING) - I reviewed patient records, labs, notes, testing and imaging myself where available.  Lab Results  Component Value Date   WBC 9.6 01/28/2019   HGB 11.6 01/28/2019   HCT 35.3 01/28/2019   MCV 74 (L) 01/28/2019   PLT 289 01/28/2019      Component Value Date/Time  NA 138 11/14/2013 1653   K 4.0 11/14/2013 1653   CL 100 11/14/2013 1653   CO2 23 11/14/2013 1653   GLUCOSE 89 11/14/2013 1653   BUN 5 (L) 11/14/2013 1653   CREATININE 0.59 11/14/2013 1653   CALCIUM 9.4 11/14/2013 1653   GFRNONAA >90 11/14/2013 1653   GFRAA >90 11/14/2013 1653   No results found for: CHOL, HDL, LDLCALC, LDLDIRECT, TRIG, CHOLHDL No results found for: [id number] No results found for: VITAMINB12 No results found for: TSH    ASSESSMENT AND PLAN 33 y.o. year old female  has a past medical history of Allergic rhinitis and Migraines. here with ***   I spent 15 minutes with the patient. 50% of this time was spent   Margie EgeSarah Sherrell Farish, UvaldaAGNP-C, DNP 04/09/2019, 8:10 PM [facility] [address], [address] [place] [id number] [phone]

## 2019-04-10 ENCOUNTER — Telehealth: Payer: Self-pay | Admitting: *Deleted

## 2019-04-10 ENCOUNTER — Ambulatory Visit: Payer: Self-pay | Admitting: Neurology

## 2019-04-10 ENCOUNTER — Encounter: Payer: Self-pay | Admitting: Neurology

## 2019-04-10 NOTE — Telephone Encounter (Signed)
Patient was no show for follow up with NP today.  

## 2019-07-28 ENCOUNTER — Ambulatory Visit: Payer: Medicaid Other | Admitting: Allergy and Immunology

## 2020-02-14 ENCOUNTER — Other Ambulatory Visit: Payer: Self-pay | Admitting: Neurology

## 2020-03-12 ENCOUNTER — Other Ambulatory Visit: Payer: Self-pay | Admitting: Neurology

## 2020-04-26 ENCOUNTER — Ambulatory Visit: Payer: Medicaid Other | Admitting: Allergy and Immunology
